# Patient Record
Sex: Female | Born: 1945
Health system: Southern US, Community
[De-identification: ages and names within clinical notes are randomized; demographics above are authoritative.]

## PROBLEM LIST (undated history)

## (undated) DIAGNOSIS — E119 Type 2 diabetes mellitus without complications: Secondary | ICD-10-CM

## (undated) DIAGNOSIS — T4145XA Adverse effect of unspecified anesthetic, initial encounter: Secondary | ICD-10-CM

## (undated) DIAGNOSIS — T8859XA Other complications of anesthesia, initial encounter: Secondary | ICD-10-CM

## (undated) DIAGNOSIS — I10 Essential (primary) hypertension: Secondary | ICD-10-CM

## (undated) DIAGNOSIS — E039 Hypothyroidism, unspecified: Secondary | ICD-10-CM

## (undated) DIAGNOSIS — Z9221 Personal history of antineoplastic chemotherapy: Secondary | ICD-10-CM

## (undated) DIAGNOSIS — Z9889 Other specified postprocedural states: Secondary | ICD-10-CM

## (undated) DIAGNOSIS — C801 Malignant (primary) neoplasm, unspecified: Secondary | ICD-10-CM

## (undated) DIAGNOSIS — R112 Nausea with vomiting, unspecified: Secondary | ICD-10-CM

## (undated) DIAGNOSIS — Z923 Personal history of irradiation: Secondary | ICD-10-CM

## (undated) DIAGNOSIS — R531 Weakness: Secondary | ICD-10-CM

## (undated) DIAGNOSIS — N189 Chronic kidney disease, unspecified: Secondary | ICD-10-CM

## (undated) DIAGNOSIS — D179 Benign lipomatous neoplasm, unspecified: Secondary | ICD-10-CM

## (undated) HISTORY — PX: OTHER SURGICAL HISTORY: SHX169

## (undated) HISTORY — DX: Type 2 diabetes mellitus without complications: E11.9

## (undated) HISTORY — PX: EYE SURGERY: SHX253

## (undated) HISTORY — DX: Essential (primary) hypertension: I10

## (undated) HISTORY — PX: KNEE ARTHROSCOPY: SUR90

---

## 1997-06-10 DIAGNOSIS — C801 Malignant (primary) neoplasm, unspecified: Secondary | ICD-10-CM

## 1997-06-10 DIAGNOSIS — Z9221 Personal history of antineoplastic chemotherapy: Secondary | ICD-10-CM

## 1997-06-10 HISTORY — DX: Malignant (primary) neoplasm, unspecified: C80.1

## 1997-06-10 HISTORY — DX: Personal history of antineoplastic chemotherapy: Z92.21

## 1997-06-10 HISTORY — PX: BREAST LUMPECTOMY: SHX2

## 1997-11-10 ENCOUNTER — Other Ambulatory Visit: Admission: RE | Admit: 1997-11-10 | Discharge: 1997-11-10 | Payer: Self-pay | Admitting: *Deleted

## 1997-11-21 ENCOUNTER — Encounter: Admission: RE | Admit: 1997-11-21 | Discharge: 1998-02-19 | Payer: Self-pay | Admitting: Radiation Oncology

## 1997-11-23 ENCOUNTER — Ambulatory Visit (HOSPITAL_COMMUNITY): Admission: RE | Admit: 1997-11-23 | Discharge: 1997-11-23 | Payer: Self-pay | Admitting: *Deleted

## 1997-12-27 ENCOUNTER — Ambulatory Visit (HOSPITAL_COMMUNITY): Admission: RE | Admit: 1997-12-27 | Discharge: 1997-12-27 | Payer: Self-pay | Admitting: *Deleted

## 1998-06-10 DIAGNOSIS — Z923 Personal history of irradiation: Secondary | ICD-10-CM

## 1998-06-10 HISTORY — DX: Personal history of irradiation: Z92.3

## 1998-06-28 ENCOUNTER — Encounter: Admission: RE | Admit: 1998-06-28 | Discharge: 1998-09-26 | Payer: Self-pay | Admitting: Radiation Oncology

## 1999-07-11 ENCOUNTER — Encounter: Payer: Self-pay | Admitting: Hematology and Oncology

## 1999-07-11 ENCOUNTER — Encounter: Admission: RE | Admit: 1999-07-11 | Discharge: 1999-07-11 | Payer: Self-pay | Admitting: Hematology and Oncology

## 1999-12-11 ENCOUNTER — Encounter: Payer: Self-pay | Admitting: Hematology and Oncology

## 1999-12-11 ENCOUNTER — Encounter: Admission: RE | Admit: 1999-12-11 | Discharge: 1999-12-11 | Payer: Self-pay | Admitting: Hematology and Oncology

## 2000-01-04 ENCOUNTER — Encounter: Admission: RE | Admit: 2000-01-04 | Discharge: 2000-01-04 | Payer: Self-pay | Admitting: Hematology and Oncology

## 2000-01-04 ENCOUNTER — Encounter: Payer: Self-pay | Admitting: Hematology and Oncology

## 2000-06-18 ENCOUNTER — Encounter: Payer: Self-pay | Admitting: Hematology and Oncology

## 2000-06-18 ENCOUNTER — Encounter: Admission: RE | Admit: 2000-06-18 | Discharge: 2000-06-18 | Payer: Self-pay | Admitting: Hematology and Oncology

## 2000-12-09 ENCOUNTER — Encounter: Payer: Self-pay | Admitting: Hematology and Oncology

## 2000-12-09 ENCOUNTER — Ambulatory Visit (HOSPITAL_COMMUNITY): Admission: RE | Admit: 2000-12-09 | Discharge: 2000-12-09 | Payer: Self-pay | Admitting: Hematology and Oncology

## 2001-06-22 ENCOUNTER — Encounter: Payer: Self-pay | Admitting: Hematology and Oncology

## 2001-06-22 ENCOUNTER — Encounter: Admission: RE | Admit: 2001-06-22 | Discharge: 2001-06-22 | Payer: Self-pay | Admitting: Hematology and Oncology

## 2001-10-12 ENCOUNTER — Encounter: Payer: Self-pay | Admitting: Family Medicine

## 2001-10-12 ENCOUNTER — Encounter: Admission: RE | Admit: 2001-10-12 | Discharge: 2001-10-12 | Payer: Self-pay | Admitting: Family Medicine

## 2001-12-08 ENCOUNTER — Encounter: Payer: Self-pay | Admitting: Family Medicine

## 2001-12-08 ENCOUNTER — Encounter: Admission: RE | Admit: 2001-12-08 | Discharge: 2001-12-08 | Payer: Self-pay | Admitting: Family Medicine

## 2002-06-23 ENCOUNTER — Encounter: Admission: RE | Admit: 2002-06-23 | Discharge: 2002-06-23 | Payer: Self-pay | Admitting: Hematology and Oncology

## 2002-06-23 ENCOUNTER — Encounter: Payer: Self-pay | Admitting: Hematology and Oncology

## 2003-01-18 ENCOUNTER — Encounter: Payer: Self-pay | Admitting: *Deleted

## 2003-01-18 ENCOUNTER — Emergency Department (HOSPITAL_COMMUNITY): Admission: EM | Admit: 2003-01-18 | Discharge: 2003-01-19 | Payer: Self-pay | Admitting: Emergency Medicine

## 2003-03-16 ENCOUNTER — Encounter: Admission: RE | Admit: 2003-03-16 | Discharge: 2003-04-06 | Payer: Self-pay | Admitting: Oncology

## 2003-03-24 ENCOUNTER — Encounter: Payer: Self-pay | Admitting: Family Medicine

## 2003-03-24 ENCOUNTER — Encounter: Admission: RE | Admit: 2003-03-24 | Discharge: 2003-03-24 | Payer: Self-pay | Admitting: Family Medicine

## 2003-06-28 ENCOUNTER — Encounter: Admission: RE | Admit: 2003-06-28 | Discharge: 2003-06-28 | Payer: Self-pay | Admitting: Family Medicine

## 2003-12-15 ENCOUNTER — Other Ambulatory Visit: Admission: RE | Admit: 2003-12-15 | Discharge: 2003-12-15 | Payer: Self-pay | Admitting: Family Medicine

## 2004-06-28 ENCOUNTER — Encounter: Admission: RE | Admit: 2004-06-28 | Discharge: 2004-06-28 | Payer: Self-pay | Admitting: Oncology

## 2005-01-25 ENCOUNTER — Ambulatory Visit: Payer: Self-pay | Admitting: Oncology

## 2005-01-31 ENCOUNTER — Ambulatory Visit (HOSPITAL_COMMUNITY): Admission: RE | Admit: 2005-01-31 | Discharge: 2005-01-31 | Payer: Self-pay | Admitting: Oncology

## 2005-03-04 ENCOUNTER — Inpatient Hospital Stay (HOSPITAL_COMMUNITY): Admission: RE | Admit: 2005-03-04 | Discharge: 2005-03-05 | Payer: Self-pay | Admitting: Obstetrics and Gynecology

## 2005-03-04 ENCOUNTER — Encounter (INDEPENDENT_AMBULATORY_CARE_PROVIDER_SITE_OTHER): Payer: Self-pay | Admitting: *Deleted

## 2005-03-08 ENCOUNTER — Ambulatory Visit (HOSPITAL_COMMUNITY): Admission: RE | Admit: 2005-03-08 | Discharge: 2005-03-08 | Payer: Self-pay | Admitting: Obstetrics and Gynecology

## 2005-03-13 ENCOUNTER — Ambulatory Visit: Payer: Self-pay | Admitting: Oncology

## 2005-06-19 ENCOUNTER — Emergency Department (HOSPITAL_COMMUNITY): Admission: EM | Admit: 2005-06-19 | Discharge: 2005-06-19 | Payer: Self-pay | Admitting: Emergency Medicine

## 2005-07-17 ENCOUNTER — Encounter: Admission: RE | Admit: 2005-07-17 | Discharge: 2005-07-17 | Payer: Self-pay | Admitting: Oncology

## 2005-09-11 ENCOUNTER — Ambulatory Visit: Payer: Self-pay | Admitting: Oncology

## 2005-09-12 LAB — COMPREHENSIVE METABOLIC PANEL
AST: 17 U/L (ref 0–37)
Alkaline Phosphatase: 57 U/L (ref 39–117)
BUN: 26 mg/dL — ABNORMAL HIGH (ref 6–23)
CO2: 27 mEq/L (ref 19–32)
Calcium: 9.4 mg/dL (ref 8.4–10.5)
Creatinine, Ser: 1.3 mg/dL — ABNORMAL HIGH (ref 0.4–1.2)

## 2005-09-12 LAB — CBC WITH DIFFERENTIAL/PLATELET
Basophils Absolute: 0 10*3/uL (ref 0.0–0.1)
Eosinophils Absolute: 0.2 10*3/uL (ref 0.0–0.5)
MCV: 81.3 fL (ref 81.0–101.0)
MONO#: 0.5 10*3/uL (ref 0.1–0.9)
NEUT#: 3.8 10*3/uL (ref 1.5–6.5)
NEUT%: 59.6 % (ref 39.6–76.8)
RBC: 4.42 10*6/uL (ref 3.70–5.32)
WBC: 6.4 10*3/uL (ref 3.9–10.0)
lymph#: 1.9 10*3/uL (ref 0.9–3.3)

## 2005-09-12 LAB — LACTATE DEHYDROGENASE: LDH: 140 U/L (ref 94–250)

## 2005-09-12 LAB — CANCER ANTIGEN 27.29: CA 27.29: 9 U/mL (ref 0–39)

## 2006-06-09 ENCOUNTER — Encounter: Admission: RE | Admit: 2006-06-09 | Discharge: 2006-06-09 | Payer: Self-pay | Admitting: Family Medicine

## 2006-07-31 ENCOUNTER — Encounter: Admission: RE | Admit: 2006-07-31 | Discharge: 2006-07-31 | Payer: Self-pay | Admitting: Oncology

## 2006-09-23 ENCOUNTER — Ambulatory Visit: Payer: Self-pay | Admitting: Oncology

## 2006-09-26 LAB — CBC WITH DIFFERENTIAL/PLATELET
BASO%: 0.5 % (ref 0.0–2.0)
EOS%: 3.4 % (ref 0.0–7.0)
HCT: 33.9 % — ABNORMAL LOW (ref 34.8–46.6)
LYMPH%: 31.8 % (ref 14.0–48.0)
MCH: 27.5 pg (ref 26.0–34.0)
MCHC: 34.5 g/dL (ref 32.0–36.0)
NEUT%: 57.9 % (ref 39.6–76.8)
Platelets: 230 10*3/uL (ref 145–400)

## 2006-09-26 LAB — COMPREHENSIVE METABOLIC PANEL
ALT: 11 U/L (ref 0–35)
AST: 13 U/L (ref 0–37)
Creatinine, Ser: 0.97 mg/dL (ref 0.40–1.20)
Total Bilirubin: 0.4 mg/dL (ref 0.3–1.2)

## 2006-09-26 LAB — LACTATE DEHYDROGENASE: LDH: 113 U/L (ref 94–250)

## 2006-10-03 LAB — IRON AND TIBC
%SAT: 13 % — ABNORMAL LOW (ref 20–55)
Iron: 50 ug/dL (ref 42–145)
TIBC: 387 ug/dL (ref 250–470)

## 2006-10-03 LAB — FERRITIN: Ferritin: 81 ng/mL (ref 10–291)

## 2007-06-11 HISTORY — PX: BREAST BIOPSY: SHX20

## 2007-08-03 ENCOUNTER — Encounter: Admission: RE | Admit: 2007-08-03 | Discharge: 2007-08-03 | Payer: Self-pay | Admitting: Family Medicine

## 2007-08-12 ENCOUNTER — Encounter (INDEPENDENT_AMBULATORY_CARE_PROVIDER_SITE_OTHER): Payer: Self-pay | Admitting: Diagnostic Radiology

## 2007-08-12 ENCOUNTER — Encounter: Admission: RE | Admit: 2007-08-12 | Discharge: 2007-08-12 | Payer: Self-pay | Admitting: Family Medicine

## 2007-08-29 ENCOUNTER — Emergency Department (HOSPITAL_COMMUNITY): Admission: EM | Admit: 2007-08-29 | Discharge: 2007-08-29 | Payer: Self-pay | Admitting: Emergency Medicine

## 2007-09-23 ENCOUNTER — Ambulatory Visit: Payer: Self-pay | Admitting: Oncology

## 2007-09-25 LAB — CBC & DIFF AND RETIC
BASO%: 1.2 % (ref 0.0–2.0)
Basophils Absolute: 0.1 10*3/uL (ref 0.0–0.1)
IRF: 0.25 (ref 0.130–0.330)
LYMPH%: 33.6 % (ref 14.0–48.0)
MCHC: 35.3 g/dL (ref 32.0–36.0)
MCV: 79.7 fL — ABNORMAL LOW (ref 81.0–101.0)
RBC: 4.21 10*6/uL (ref 3.70–5.32)
RDW: 13.4 % (ref 11.3–14.5)
RETIC #: 47.6 10*3/uL (ref 19.7–115.1)
Retic %: 1.1 % (ref 0.4–2.3)
WBC: 6 10*3/uL (ref 3.9–10.0)
lymph#: 2 10*3/uL (ref 0.9–3.3)

## 2007-09-25 LAB — COMPREHENSIVE METABOLIC PANEL
ALT: 15 U/L (ref 0–35)
Albumin: 4.1 g/dL (ref 3.5–5.2)
Chloride: 103 mEq/L (ref 96–112)
Potassium: 4.4 mEq/L (ref 3.5–5.3)
Total Bilirubin: 0.4 mg/dL (ref 0.3–1.2)

## 2007-09-25 LAB — LACTATE DEHYDROGENASE: LDH: 136 U/L (ref 94–250)

## 2007-09-25 LAB — FERRITIN: Ferritin: 66 ng/mL (ref 10–291)

## 2007-09-25 LAB — IRON AND TIBC
%SAT: 20 % (ref 20–55)
Iron: 67 ug/dL (ref 42–145)
TIBC: 332 ug/dL (ref 250–470)
UIBC: 265 ug/dL

## 2007-09-25 LAB — CANCER ANTIGEN 27.29: CA 27.29: 9 U/mL (ref 0–39)

## 2007-09-25 LAB — MORPHOLOGY: PLT EST: ADEQUATE

## 2007-09-25 LAB — CHCC SMEAR

## 2008-08-03 ENCOUNTER — Encounter: Admission: RE | Admit: 2008-08-03 | Discharge: 2008-08-03 | Payer: Self-pay | Admitting: Family Medicine

## 2009-08-04 ENCOUNTER — Encounter: Admission: RE | Admit: 2009-08-04 | Discharge: 2009-08-04 | Payer: Self-pay | Admitting: Family Medicine

## 2010-06-30 ENCOUNTER — Other Ambulatory Visit: Payer: Self-pay | Admitting: Family Medicine

## 2010-06-30 DIAGNOSIS — Z9889 Other specified postprocedural states: Secondary | ICD-10-CM

## 2010-07-01 ENCOUNTER — Encounter: Payer: Self-pay | Admitting: Internal Medicine

## 2010-08-06 ENCOUNTER — Ambulatory Visit
Admission: RE | Admit: 2010-08-06 | Discharge: 2010-08-06 | Disposition: A | Discharge status: Home / Self Care | Payer: BC Managed Care – PPO | Source: Ambulatory Visit | Attending: Family Medicine | Admitting: Family Medicine

## 2010-08-06 DIAGNOSIS — Z9889 Other specified postprocedural states: Secondary | ICD-10-CM

## 2010-10-26 NOTE — H&P (Signed)
NAMERAIANA, PHARRIS               ACCOUNT NO.:  [id number]   MEDICAL RECORD NO.:  [id number]           PATIENT TYPE:   LOCATION:                                 FACILITY:   PHYSICIAN:  Charles A. Delcambre, MD    DATE OF BIRTH:   DATE OF ADMISSION:  03/04/2005  DATE OF DISCHARGE:                                HISTORY & PHYSICAL   ADDENDUM:  H&P has been reviewed, dictated originally on January 21, 2005.  There are no changes.  No changes in the history and physical are noted.  Physical examination is unchanged.  Heart and lung examination is updated  today and status is unchanged.  Will proceed on as outlined in the history  and physical as dictated previously for TVH/BSO, A&P repair as technically  feasible at the time of procedure.      Charles A. Sydnee Cabal, MD  Electronically Signed     CAD/MEDQ  D:  02/25/2005  T:  02/25/2005  Job:  [id number]

## 2010-10-26 NOTE — Op Note (Signed)
NAMEKRISTIEN, [name]             ACCOUNT NO.:  [id number]   MEDICAL RECORD NO.:  [id number]          PATIENT TYPE:  INP   LOCATION:  9302                          [facility]ACILITY:  [facility]   PHYSICIAN:  Charles A. Delcambre, MDDATE OF BIRTH:  [birth year]   DATE OF PROCEDURE:  03/04/2005  DATE OF DISCHARGE:                                 OPERATIVE REPORT   PREOPERATIVE DIAGNOSES:  1.  Cystocele.  2.  Rectocele.  3.  Uterine prolapse.   POSTOPERATIVE DIAGNOSES:  1.  Cystocele.  2.  Minor Rectocele.  3.  Uterine prolapse.   PROCEDURE:  1.  Transvaginal hysterectomy.  2.  Right salpingo-oophorectomy.  3.  Laparoscopic left salpingo-oophorectomy and lap retrieval   ASSISTANT:  Ginger Carne, M.D.   COMPLICATIONS:  At the time of vaginal hysterectomy and right salpingo-  oophorectomy, the left ovary was noted to be high in the pelvis.  In an  attempt to get the left ovary out, oozing was encountered on the utero-  ovarian pedicle.  On further attempts to isolate the pedicle, the lap was  pushed high and was unable to be retrieved vaginally.  There was abandonment  of removal of the ovary vaginally.  For this reason, the decision was  obviously made to proceed with laparoscopy to retrieve the lap and at the  same time to go ahead and remove the ovary laparoscopically.  See dictation  below.   ANESTHESIA:  General by the endotracheal route.   SPECIMENS:  Uterus, tubes, and ovaries to pathology.   ESTIMATED BLOOD LOSS:  Less than or equal to 200 mL.   COUNTS:  Instrument, sponge, and needle count correct x2.   DESCRIPTION OF PROCEDURE:  The patient was taken to the operating room and  placed in supine position.  General anesthetic was induced without  difficulty.  She was then placed in dorsal lithotomy position in universal  stirrups and sterile prep and drape was undertaken.  She was drained with a  straight catheter in the bladder and the cervix was noted to be flush with  the  vaginal cuff with history of some type of cervical pathology cells  treated.  Pathology of recent date had been negative through my office.  The cervical os could be seen but could not be penetrated.  The cervical  area was grasped with Allis clamps and a Bovie was used to open the vaginal  mucosa over the os area of the cervix.  Mayo scissors were used to undermine  the vaginal mucosa and peel this back over the cervix.  The cervix was then  isolated and the vaginal mucosa was mobilized over the cervix anteriorly and  posteriorly with sharp dissection.  This was taken laterally to expose the  uterosacral ligaments.  Anteriorly, the bladder was taken off of the cervix  with some sharp dissection and blunt dissection with the finger and then  moistened RayTec lap was packed into this area, further dissecting the  bladder off of the anterior uterus.  The peritoneum was entered with blunt  dissection and the bowel was clearly seen.  There  was no evidence of damage  to the bladder.  Posterior colpotomy was undertaken.  No damage to bowel was  noted.  The uterosacral ligaments were then transected and transfixion  stitch with 2-0 Vicryl held this.  The uterine vessels were isolated next  and simple stitched with 0 Vicryl with good hemostasis resulting on either  side.  The cardinal ligaments were taken down in successive pedicles and  transfixion stitched with 0 Vicryl, the final stitches of which cross  clamped the uterine cornua regions with free tie and then a transfixion  stitch of 0 Vicryl.  Hemostasis was excellent.  The right tube and ovary  with very minimal tube removal were isolated, free tied, transfixion  stitched, and removed.  Hemostasis was excellent.  The left ovary as noted  above could not be isolated and complications noted above.  The cuff was  closed with 0 Vicryl figure-of-8 sutures for laparoscopy to be undertaken.  Attention was then turned to laparoscopy.  The patient was  placed in lower  dorsal lithotomy position and reprepped and draped.  Umbilical and  suprapubic areas were injected with 0.25% plain Marcaine, a total of 7 mL  divided.  The umbilical port was placed, first with the Veress needle and  anterior traction.  Aspiration, injection, and reaspiration hanging drop  test showed insufflation pressure was under 8 mmHg at 2 L insufflation per  minute, all indicating intraperitoneal location.  Adequate pneumoperitoneum  at 3 L insufflation with CO2 was noted.  The Veress needle was removed and  10 mm trocar port was placed and verification of intraperitoneal location  was made and there was o damage to bowel, bladder, or vascular structures.  Under direct visualization, a 10 mm port was placed in the midline  suprapubically.  Pelvectomy was then undertaken.  The lap was clearly seen  underlying the omentum and lateral to the sigmoid colon on the left.  This  was grasped with a tooth grasper and removed through the 10 mm trocar site  suprapubically without difficulty.  Irrigation was carried out.  All areas  had good hemostasis.  The left ovary was grasped, lifted up.  Beneath the  ovary and under clear visualization was the left ureter seen peristalsing.  The infundibulopelvic pedicle was isolated well lateral and above the ureter  and tripolar cautery was then used to cauterize and cut the  infundibulopelvic pedicle without damage to the surrounding strictures,  including ureter, under direct visualization.  Hemostasis of the pedicle was  excellent.  Successive pedicles were taken and the ovary and tube were  removed without difficulty.  These were removed through the 10 mm port in  the midline.  Irrigation was carried out.  Hemostasis was again verified in  all areas.  Desufflation was allowed to occur.  Low-pressure hemostasis was  excellent.  Single stitches of 0 Vicryl were used to close the 10 mm fascial areas for the trocars.  Monocryl, 3-0, was  used to close the skin.  Vicryl,  3-0, was placed in the left lower quadrant skin area.  Attention was then  turned again to the vagina.  Cuff sutures that had been placed earlier with  interrupted single stitches were removed.  The cuff was reclosed with angle  sutures on either angle and then a running 0 Vicryl across the cuff.  Attention was turned to anterior repair.  Before the final closure of the  cuff, attention was given to the anterior repair.  Vaginal mucosa  was  undermined anteriorly and the bladder was dissected off sharply with the  Metzenbaum scissors and the knife to expose the endopelvic fascia.  This was  then closed across the cystocele, reducing the cystocele with interrupted 2-  0 Vicryl sutures.  These were tied, carefully reducing the endopelvic fascia  over the cystocele with good results.  Extra vaginal mucosa was removed and  then 2-0 Vicryl running locking suture was used to close the anterior  vaginal mucosa all the way back to the vaginal cuff area.  Hemostasis was  excellent.  The vagina was packed with 1 inch gauze moistened with Estrace  vaginal cream.  The patient tolerated the procedure well, was awakened and  taken to the recovery room with physician in attendance.      Charles A. Sydnee Cabal, MD  Electronically Signed     CAD/MEDQ  D:  03/04/2005  T:  03/04/2005  Job:  [id number]

## 2010-10-26 NOTE — H&P (Signed)
NAMEKAOIR, LOREE             ACCOUNT NO.:  [id number]   MEDICAL RECORD NO.:  [id number]          PATIENT TYPE:  INP   LOCATION:                                FACILITY:  [facility]   PHYSICIAN:  Charles A. Delcambre, MDDATE OF BIRTH:  November 26, 1945   DATE OF ADMISSION:  01/21/2005  DATE OF DISCHARGE:                                HISTORY & PHYSICAL   CHIEF COMPLAINT:  Bulge at the vagina, symptomatic.   HISTORY OF PRESENT ILLNESS:  A 65 year old gravida 4, para 2-0-0-2, with  bulging something out of her vagina and into her underwear with pressure and  some low back pain, rubbing onto her underwear.  No vaginal bleeding.  Some  urgency for urination at night and sometimes waking up wet and at other  times not being able to make it to the restroom.  Main problem is the  bulging out, having to push something back in to achieve comfort.  This has  been getting worse over the last several years, but even more so in the last  several months.  Normal Pap smear in July of 2005 with Elsworth Soho,  M.D.   PAST MEDICAL HISTORY:  Diabetes, hypertension, and thyroid disease.   PAST SURGICAL HISTORY:  Breast cancer, 6 years out now from therapy.  Hypothyroidism.   MEDICATIONS:  1.  Glipizide 10 mg two tablets once a day.  2.  Metformin 2 to 2-1/2 500 mg once a day.  3.  Levothyroxine 150 mcg once a day.  4.  Lisinopril 20 mg once a day.  5.  Hydrochlorothiazide 25 mg once a day.  6.  Vitamin E.  7.  Calcium 600 mg plus 400 units of vitamin D once a day.   ALLERGIES:  CODEINE, reaction not specified.   SOCIAL HISTORY:  No tobacco, ethanol, or drug use, or STD exposure in the  past.  The patient is married and in a monogamous relationship with her  husband.   FAMILY HISTORY:  Father is deceased, age not specified.  Mother is age 56  and deceased from lung cancer, hypertension, and heart disease.  Brother  deceased, age not specified from hypertension.  Sister age 53 with lung  cancer,  hypertension, and diabetes.  Sister deceased at age 109 with heart  disease.  Sister deceased at age 10 with heart disease.  Otherwise negative.   REVIEW OF SYSTEMS:  Symptomatic uterine prolapse.  Denies fever, chills,  rashes, lesions, headaches, or dizziness.  Some seasonal allergies.  No  chest pain, shortness of breath, wheezing.  No diarrhea, constipation,  bleeding, melena, or hematochezia.  Urgency as noted above, frequency,  dysuria denied.  Incontinence as noted above.  Hematuria not noted.  No  galactorrhea or emotional changes.   PHYSICAL EXAMINATION:  GENERAL:  Alert and oriented x3.  In no acute  distress.  VITAL SIGNS:  Pulse 72, respirations 20, blood pressure 138/82.  HEENT:  Grossly within normal limits.  NECK:  Supple without thyromegaly or adenopathy.  LUNGS:  Clear bilaterally.  BACK:  No CVAT.  Vertebral column nontender to palpation.  BREASTS:  Deferred to PCP examination within the last 6 months.  HEART:  Regular rate and rhythm without murmurs, rubs, or gallops.  ABDOMEN:  Soft, flat, and nontender.  No hepatosplenomegaly or other masses  noted.  PELVIC:  Normal external female genitalia.  Bartholin's, urethra, and  Skene's within normal limits.  Vulva; there is a fleshy-appearing  protuberance from the vulva at the vaginal introitus, soft, and easily  replaced in the vagina.  It appears to be a cystocele on first glance and  touch to replace.  A speculum was placed and a very large cystocele was  noted.  A small rectocele was noted.  Uterus appeared to be small and up  inside the vault.  With Valsalva, the large cystocele protrudes.  On  bimanual examination, the uterus palpates to be small and very high.  I had  difficulty seeing the cervix with moving the large cystocele anteriorly and  could not definitely tell that the uterus protruded down because of the  large cystocele.  RECTAL:  Rectovaginal examination confirmed the above noted examination.  Good  anal sphincter tone was present.  Brown stool was noted in the vault.  Examination was nontender.  EXTREMITIES:  Nontender with minimal edema, symmetrical bilaterally.   ASSESSMENT:  Large cystocele, small rectocele, and some uterine prolapse  behind the cystocele.   PLAN:  Transvaginal hysterectomy with uterus measured on ultrasound 6.2 x  3.9 cm and normal appearing ovaries.  Possibly would proceed on with BSO  bilaterally and would move on to mainly procedure to be cystocele repair,  possible rectocele repair.  The patient gives informed consent and accepts  the risks of infection, bleeding, bowel and bladder damage, and blood  products risks including HIV exposure, hepatitis exposure.  She understands  if the uterus is not descended down and just mainly under anesthesia, we  find mainly a very large cystocele, we may proceed on with cystocele only  repair and possible rectocele.  All questions were answered.  We will  proceed with surgery as outlined.  She go on and take her hypertension  medications with a sip of water the morning of admission.  Will hold her  diabetes medication that morning as she will be NPO.  SCD's will be used  perioperatively, Cefoxitin will be given 1 gram preoperatively and CBC and  CMP will be drawn perioperatively.  All questions were answered and we will  proceed as outlined.  Will likely get [facility] to manage while she  is in the hospital for multiple medical problems.      Charles A. Sydnee Cabal, MD  Electronically Signed     CAD/MEDQ  D:  01/21/2005  T:  01/21/2005  Job:  [id number]

## 2011-07-08 ENCOUNTER — Other Ambulatory Visit: Payer: Self-pay | Admitting: Family Medicine

## 2011-07-08 DIAGNOSIS — Z1231 Encounter for screening mammogram for malignant neoplasm of breast: Secondary | ICD-10-CM

## 2011-08-08 ENCOUNTER — Ambulatory Visit
Admission: RE | Admit: 2011-08-08 | Discharge: 2011-08-08 | Disposition: A | Discharge status: Home / Self Care | Payer: Medicare Other | Source: Ambulatory Visit | Attending: Family Medicine | Admitting: Family Medicine

## 2011-08-08 DIAGNOSIS — Z1231 Encounter for screening mammogram for malignant neoplasm of breast: Secondary | ICD-10-CM

## 2011-12-05 ENCOUNTER — Other Ambulatory Visit: Payer: Self-pay | Admitting: Family Medicine

## 2011-12-05 DIAGNOSIS — M79606 Pain in leg, unspecified: Secondary | ICD-10-CM

## 2011-12-06 ENCOUNTER — Ambulatory Visit
Admission: RE | Admit: 2011-12-06 | Discharge: 2011-12-06 | Disposition: A | Discharge status: Home / Self Care | Payer: Medicare Other | Source: Ambulatory Visit | Attending: Family Medicine | Admitting: Family Medicine

## 2011-12-06 DIAGNOSIS — M79606 Pain in leg, unspecified: Secondary | ICD-10-CM

## 2012-03-12 ENCOUNTER — Other Ambulatory Visit: Payer: Self-pay | Admitting: Family Medicine

## 2012-03-12 ENCOUNTER — Ambulatory Visit
Admission: RE | Admit: 2012-03-12 | Discharge: 2012-03-12 | Disposition: A | Discharge status: Home / Self Care | Payer: Medicare Other | Source: Ambulatory Visit | Attending: Family Medicine | Admitting: Family Medicine

## 2012-03-12 DIAGNOSIS — E871 Hypo-osmolality and hyponatremia: Secondary | ICD-10-CM

## 2012-03-13 ENCOUNTER — Other Ambulatory Visit: Payer: Self-pay | Admitting: Family Medicine

## 2012-03-13 DIAGNOSIS — R9389 Abnormal findings on diagnostic imaging of other specified body structures: Secondary | ICD-10-CM

## 2012-03-17 ENCOUNTER — Ambulatory Visit
Admission: RE | Admit: 2012-03-17 | Discharge: 2012-03-17 | Disposition: A | Discharge status: Home / Self Care | Payer: Medicare Other | Source: Ambulatory Visit | Attending: Family Medicine | Admitting: Family Medicine

## 2012-03-17 DIAGNOSIS — R9389 Abnormal findings on diagnostic imaging of other specified body structures: Secondary | ICD-10-CM

## 2012-04-02 ENCOUNTER — Other Ambulatory Visit: Payer: Self-pay | Admitting: Family Medicine

## 2012-04-02 DIAGNOSIS — R9389 Abnormal findings on diagnostic imaging of other specified body structures: Secondary | ICD-10-CM

## 2012-04-03 ENCOUNTER — Other Ambulatory Visit: Payer: Medicare Other

## 2012-04-10 ENCOUNTER — Ambulatory Visit
Admission: RE | Admit: 2012-04-10 | Discharge: 2012-04-10 | Disposition: A | Discharge status: Home / Self Care | Payer: Medicare Other | Source: Ambulatory Visit | Attending: Family Medicine | Admitting: Family Medicine

## 2012-04-10 DIAGNOSIS — R9389 Abnormal findings on diagnostic imaging of other specified body structures: Secondary | ICD-10-CM

## 2012-04-10 MED ORDER — IOHEXOL 300 MG/ML  SOLN
100.0000 mL | Freq: Once | INTRAMUSCULAR | Status: AC | PRN
  Administered 2012-04-10: 100 mL via INTRAVENOUS

## 2012-07-06 ENCOUNTER — Other Ambulatory Visit: Payer: Self-pay | Admitting: Family Medicine

## 2012-07-06 DIAGNOSIS — Z1231 Encounter for screening mammogram for malignant neoplasm of breast: Secondary | ICD-10-CM

## 2012-08-10 ENCOUNTER — Ambulatory Visit
Admission: RE | Admit: 2012-08-10 | Discharge: 2012-08-10 | Disposition: A | Discharge status: Home / Self Care | Payer: Medicare Other | Source: Ambulatory Visit | Attending: Family Medicine | Admitting: Family Medicine

## 2013-06-10 HISTORY — PX: BREAST BIOPSY: SHX20

## 2013-07-07 ENCOUNTER — Other Ambulatory Visit: Payer: Self-pay

## 2013-07-07 DIAGNOSIS — Z1231 Encounter for screening mammogram for malignant neoplasm of breast: Secondary | ICD-10-CM

## 2013-07-07 DIAGNOSIS — Z9889 Other specified postprocedural states: Secondary | ICD-10-CM

## 2013-07-07 DIAGNOSIS — Z853 Personal history of malignant neoplasm of breast: Secondary | ICD-10-CM

## 2013-08-11 ENCOUNTER — Ambulatory Visit
Admission: RE | Admit: 2013-08-11 | Discharge: 2013-08-11 | Disposition: A | Discharge status: Home / Self Care | Payer: Commercial Managed Care - HMO | Source: Ambulatory Visit

## 2013-08-11 DIAGNOSIS — Z1231 Encounter for screening mammogram for malignant neoplasm of breast: Secondary | ICD-10-CM

## 2013-08-11 DIAGNOSIS — Z9889 Other specified postprocedural states: Secondary | ICD-10-CM

## 2013-08-11 DIAGNOSIS — Z853 Personal history of malignant neoplasm of breast: Secondary | ICD-10-CM

## 2013-08-12 ENCOUNTER — Other Ambulatory Visit: Payer: Self-pay | Admitting: Family Medicine

## 2013-08-12 DIAGNOSIS — R928 Other abnormal and inconclusive findings on diagnostic imaging of breast: Secondary | ICD-10-CM

## 2013-08-25 ENCOUNTER — Other Ambulatory Visit: Payer: Self-pay | Admitting: Family Medicine

## 2013-08-25 ENCOUNTER — Ambulatory Visit
Admission: RE | Admit: 2013-08-25 | Discharge: 2013-08-25 | Disposition: A | Discharge status: Home / Self Care | Payer: Commercial Managed Care - HMO | Source: Ambulatory Visit | Attending: Family Medicine | Admitting: Family Medicine

## 2013-08-25 DIAGNOSIS — R928 Other abnormal and inconclusive findings on diagnostic imaging of breast: Secondary | ICD-10-CM

## 2013-08-30 ENCOUNTER — Ambulatory Visit
Admission: RE | Admit: 2013-08-30 | Discharge: 2013-08-30 | Disposition: A | Discharge status: Home / Self Care | Payer: Commercial Managed Care - HMO | Source: Ambulatory Visit | Attending: Family Medicine | Admitting: Family Medicine

## 2013-08-30 DIAGNOSIS — R928 Other abnormal and inconclusive findings on diagnostic imaging of breast: Secondary | ICD-10-CM

## 2014-07-22 ENCOUNTER — Other Ambulatory Visit: Payer: Self-pay

## 2014-07-22 DIAGNOSIS — M792 Neuralgia and neuritis, unspecified: Secondary | ICD-10-CM | POA: Diagnosis not present

## 2014-07-22 DIAGNOSIS — M545 Low back pain: Secondary | ICD-10-CM | POA: Diagnosis not present

## 2014-07-22 DIAGNOSIS — I10 Essential (primary) hypertension: Secondary | ICD-10-CM | POA: Diagnosis not present

## 2014-07-22 DIAGNOSIS — M25531 Pain in right wrist: Secondary | ICD-10-CM | POA: Diagnosis not present

## 2014-07-22 DIAGNOSIS — Z1211 Encounter for screening for malignant neoplasm of colon: Secondary | ICD-10-CM | POA: Diagnosis not present

## 2014-07-22 DIAGNOSIS — Z1231 Encounter for screening mammogram for malignant neoplasm of breast: Secondary | ICD-10-CM

## 2014-07-22 DIAGNOSIS — E119 Type 2 diabetes mellitus without complications: Secondary | ICD-10-CM | POA: Diagnosis not present

## 2014-07-22 DIAGNOSIS — E039 Hypothyroidism, unspecified: Secondary | ICD-10-CM | POA: Diagnosis not present

## 2014-07-22 DIAGNOSIS — J309 Allergic rhinitis, unspecified: Secondary | ICD-10-CM | POA: Diagnosis not present

## 2014-08-05 DIAGNOSIS — Z1211 Encounter for screening for malignant neoplasm of colon: Secondary | ICD-10-CM | POA: Diagnosis not present

## 2014-08-15 ENCOUNTER — Ambulatory Visit
Admission: RE | Admit: 2014-08-15 | Discharge: 2014-08-15 | Disposition: A | Discharge status: Home / Self Care | Payer: Commercial Managed Care - HMO | Source: Ambulatory Visit

## 2014-08-15 DIAGNOSIS — M5416 Radiculopathy, lumbar region: Secondary | ICD-10-CM | POA: Diagnosis not present

## 2014-08-15 DIAGNOSIS — Z1231 Encounter for screening mammogram for malignant neoplasm of breast: Secondary | ICD-10-CM

## 2014-08-17 DIAGNOSIS — H5210 Myopia, unspecified eye: Secondary | ICD-10-CM | POA: Diagnosis not present

## 2014-08-17 DIAGNOSIS — E119 Type 2 diabetes mellitus without complications: Secondary | ICD-10-CM | POA: Diagnosis not present

## 2014-08-17 DIAGNOSIS — H524 Presbyopia: Secondary | ICD-10-CM | POA: Diagnosis not present

## 2015-01-20 DIAGNOSIS — I10 Essential (primary) hypertension: Secondary | ICD-10-CM | POA: Diagnosis not present

## 2015-01-20 DIAGNOSIS — E1165 Type 2 diabetes mellitus with hyperglycemia: Secondary | ICD-10-CM | POA: Diagnosis not present

## 2015-01-20 DIAGNOSIS — M545 Low back pain: Secondary | ICD-10-CM | POA: Diagnosis not present

## 2015-01-20 DIAGNOSIS — M792 Neuralgia and neuritis, unspecified: Secondary | ICD-10-CM | POA: Diagnosis not present

## 2015-01-20 DIAGNOSIS — H43399 Other vitreous opacities, unspecified eye: Secondary | ICD-10-CM | POA: Diagnosis not present

## 2015-01-20 DIAGNOSIS — E039 Hypothyroidism, unspecified: Secondary | ICD-10-CM | POA: Diagnosis not present

## 2015-03-02 DIAGNOSIS — Z23 Encounter for immunization: Secondary | ICD-10-CM | POA: Diagnosis not present

## 2015-07-18 ENCOUNTER — Other Ambulatory Visit: Payer: Self-pay

## 2015-07-18 DIAGNOSIS — Z1231 Encounter for screening mammogram for malignant neoplasm of breast: Secondary | ICD-10-CM

## 2015-08-08 DIAGNOSIS — I10 Essential (primary) hypertension: Secondary | ICD-10-CM | POA: Diagnosis not present

## 2015-08-08 DIAGNOSIS — L659 Nonscarring hair loss, unspecified: Secondary | ICD-10-CM | POA: Diagnosis not present

## 2015-08-08 DIAGNOSIS — E039 Hypothyroidism, unspecified: Secondary | ICD-10-CM | POA: Diagnosis not present

## 2015-08-08 DIAGNOSIS — M792 Neuralgia and neuritis, unspecified: Secondary | ICD-10-CM | POA: Diagnosis not present

## 2015-08-08 DIAGNOSIS — E119 Type 2 diabetes mellitus without complications: Secondary | ICD-10-CM | POA: Diagnosis not present

## 2015-08-08 DIAGNOSIS — J309 Allergic rhinitis, unspecified: Secondary | ICD-10-CM | POA: Diagnosis not present

## 2015-08-08 DIAGNOSIS — Z7984 Long term (current) use of oral hypoglycemic drugs: Secondary | ICD-10-CM | POA: Diagnosis not present

## 2015-08-16 ENCOUNTER — Ambulatory Visit
Admission: RE | Admit: 2015-08-16 | Discharge: 2015-08-16 | Disposition: A | Discharge status: Home / Self Care | Payer: Commercial Managed Care - HMO | Source: Ambulatory Visit

## 2015-08-16 DIAGNOSIS — Z1231 Encounter for screening mammogram for malignant neoplasm of breast: Secondary | ICD-10-CM | POA: Diagnosis not present

## 2015-08-18 DIAGNOSIS — H5210 Myopia, unspecified eye: Secondary | ICD-10-CM | POA: Diagnosis not present

## 2015-08-18 DIAGNOSIS — H524 Presbyopia: Secondary | ICD-10-CM | POA: Diagnosis not present

## 2015-09-06 DIAGNOSIS — L659 Nonscarring hair loss, unspecified: Secondary | ICD-10-CM | POA: Diagnosis not present

## 2015-09-08 DIAGNOSIS — L659 Nonscarring hair loss, unspecified: Secondary | ICD-10-CM | POA: Diagnosis not present

## 2015-09-08 DIAGNOSIS — Z79899 Other long term (current) drug therapy: Secondary | ICD-10-CM | POA: Diagnosis not present

## 2016-02-07 DIAGNOSIS — M545 Low back pain: Secondary | ICD-10-CM | POA: Diagnosis not present

## 2016-02-07 DIAGNOSIS — Z23 Encounter for immunization: Secondary | ICD-10-CM | POA: Diagnosis not present

## 2016-02-07 DIAGNOSIS — I10 Essential (primary) hypertension: Secondary | ICD-10-CM | POA: Diagnosis not present

## 2016-02-07 DIAGNOSIS — M792 Neuralgia and neuritis, unspecified: Secondary | ICD-10-CM | POA: Diagnosis not present

## 2016-02-07 DIAGNOSIS — Z7984 Long term (current) use of oral hypoglycemic drugs: Secondary | ICD-10-CM | POA: Diagnosis not present

## 2016-02-07 DIAGNOSIS — J309 Allergic rhinitis, unspecified: Secondary | ICD-10-CM | POA: Diagnosis not present

## 2016-02-07 DIAGNOSIS — E119 Type 2 diabetes mellitus without complications: Secondary | ICD-10-CM | POA: Diagnosis not present

## 2016-02-07 DIAGNOSIS — E039 Hypothyroidism, unspecified: Secondary | ICD-10-CM | POA: Diagnosis not present

## 2016-02-08 DIAGNOSIS — M545 Low back pain: Secondary | ICD-10-CM | POA: Diagnosis not present

## 2016-02-16 DIAGNOSIS — M545 Low back pain: Secondary | ICD-10-CM | POA: Diagnosis not present

## 2016-02-21 DIAGNOSIS — M545 Low back pain: Secondary | ICD-10-CM | POA: Diagnosis not present

## 2016-02-23 DIAGNOSIS — M545 Low back pain: Secondary | ICD-10-CM | POA: Diagnosis not present

## 2016-02-26 DIAGNOSIS — M545 Low back pain: Secondary | ICD-10-CM | POA: Diagnosis not present

## 2016-02-29 DIAGNOSIS — M545 Low back pain: Secondary | ICD-10-CM | POA: Diagnosis not present

## 2016-03-05 DIAGNOSIS — M545 Low back pain: Secondary | ICD-10-CM | POA: Diagnosis not present

## 2016-03-07 DIAGNOSIS — M545 Low back pain: Secondary | ICD-10-CM | POA: Diagnosis not present

## 2016-03-11 DIAGNOSIS — M545 Low back pain: Secondary | ICD-10-CM | POA: Diagnosis not present

## 2016-03-13 DIAGNOSIS — M545 Low back pain: Secondary | ICD-10-CM | POA: Diagnosis not present

## 2016-03-19 DIAGNOSIS — M545 Low back pain: Secondary | ICD-10-CM | POA: Diagnosis not present

## 2016-03-21 DIAGNOSIS — M545 Low back pain: Secondary | ICD-10-CM | POA: Diagnosis not present

## 2016-03-25 DIAGNOSIS — M545 Low back pain: Secondary | ICD-10-CM | POA: Diagnosis not present

## 2016-03-28 DIAGNOSIS — M545 Low back pain: Secondary | ICD-10-CM | POA: Diagnosis not present

## 2016-04-13 DIAGNOSIS — I10 Essential (primary) hypertension: Secondary | ICD-10-CM | POA: Diagnosis not present

## 2016-04-13 DIAGNOSIS — R51 Headache: Secondary | ICD-10-CM | POA: Diagnosis not present

## 2016-05-27 DIAGNOSIS — L309 Dermatitis, unspecified: Secondary | ICD-10-CM | POA: Diagnosis not present

## 2016-05-27 DIAGNOSIS — R51 Headache: Secondary | ICD-10-CM | POA: Diagnosis not present

## 2016-05-27 DIAGNOSIS — I10 Essential (primary) hypertension: Secondary | ICD-10-CM | POA: Diagnosis not present

## 2016-07-11 ENCOUNTER — Other Ambulatory Visit: Payer: Self-pay | Admitting: Family Medicine

## 2016-07-11 DIAGNOSIS — Z1231 Encounter for screening mammogram for malignant neoplasm of breast: Secondary | ICD-10-CM

## 2016-07-30 DIAGNOSIS — H34832 Tributary (branch) retinal vein occlusion, left eye, with macular edema: Secondary | ICD-10-CM | POA: Diagnosis not present

## 2016-07-30 DIAGNOSIS — H35361 Drusen (degenerative) of macula, right eye: Secondary | ICD-10-CM | POA: Diagnosis not present

## 2016-07-30 DIAGNOSIS — E113293 Type 2 diabetes mellitus with mild nonproliferative diabetic retinopathy without macular edema, bilateral: Secondary | ICD-10-CM | POA: Diagnosis not present

## 2016-08-06 DIAGNOSIS — H34832 Tributary (branch) retinal vein occlusion, left eye, with macular edema: Secondary | ICD-10-CM | POA: Diagnosis not present

## 2016-08-08 DIAGNOSIS — J309 Allergic rhinitis, unspecified: Secondary | ICD-10-CM | POA: Diagnosis not present

## 2016-08-08 DIAGNOSIS — E119 Type 2 diabetes mellitus without complications: Secondary | ICD-10-CM | POA: Diagnosis not present

## 2016-08-08 DIAGNOSIS — M792 Neuralgia and neuritis, unspecified: Secondary | ICD-10-CM | POA: Diagnosis not present

## 2016-08-08 DIAGNOSIS — I10 Essential (primary) hypertension: Secondary | ICD-10-CM | POA: Diagnosis not present

## 2016-08-08 DIAGNOSIS — E039 Hypothyroidism, unspecified: Secondary | ICD-10-CM | POA: Diagnosis not present

## 2016-08-16 ENCOUNTER — Ambulatory Visit
Admission: RE | Admit: 2016-08-16 | Discharge: 2016-08-16 | Disposition: A | Discharge status: Home / Self Care | Payer: Commercial Managed Care - HMO | Source: Ambulatory Visit | Attending: Family Medicine | Admitting: Family Medicine

## 2016-08-16 DIAGNOSIS — Z1231 Encounter for screening mammogram for malignant neoplasm of breast: Secondary | ICD-10-CM | POA: Diagnosis not present

## 2016-08-16 HISTORY — DX: Personal history of irradiation: Z92.3

## 2016-08-16 HISTORY — DX: Personal history of antineoplastic chemotherapy: Z92.21

## 2016-09-25 ENCOUNTER — Ambulatory Visit: Payer: Self-pay | Admitting: Podiatry

## 2016-10-02 ENCOUNTER — Encounter: Payer: Self-pay | Admitting: Podiatry

## 2016-10-02 ENCOUNTER — Ambulatory Visit (INDEPENDENT_AMBULATORY_CARE_PROVIDER_SITE_OTHER): Payer: Medicare HMO

## 2016-10-02 ENCOUNTER — Ambulatory Visit (INDEPENDENT_AMBULATORY_CARE_PROVIDER_SITE_OTHER): Payer: Medicare HMO | Admitting: Podiatry

## 2016-10-02 DIAGNOSIS — M79672 Pain in left foot: Secondary | ICD-10-CM

## 2016-10-02 DIAGNOSIS — M7752 Other enthesopathy of left foot: Secondary | ICD-10-CM | POA: Diagnosis not present

## 2016-10-02 DIAGNOSIS — M722 Plantar fascial fibromatosis: Secondary | ICD-10-CM

## 2016-10-02 DIAGNOSIS — M79671 Pain in right foot: Secondary | ICD-10-CM | POA: Diagnosis not present

## 2016-10-02 MED ORDER — MELOXICAM 15 MG PO TABS: 15.0000 mg | ORAL_TABLET | Freq: Every day | ORAL | 1 refills | Status: AC

## 2016-10-04 NOTE — Progress Notes (Signed)
   Subjective: Patient with PMHx of T2DM presents today for intermittent pain and tenderness in the right foot. Patient states the foot pain has been hurting for several weeks now. Patient states that it hurts in the mornings with the first steps out of bed and walking. Patient presents today for further treatment and evaluation  Objective: Physical Exam General: The patient is alert and oriented x3 in no acute distress.  Dermatology: Skin is warm, dry and supple bilateral lower extremities. Negative for open lesions or macerations bilateral.   Vascular: Dorsalis Pedis and Posterior Tibial pulses palpable bilateral.  Capillary fill time is immediate to all digits.  Neurological: Epicritic and protective threshold intact bilateral.   Musculoskeletal: Tenderness to palpation at the medial calcaneal tubercale and through the insertion of the plantar fascia of the right foot. Left foot with tenderness to palpation along the 5th MPJ. All other joints range of motion within normal limits bilateral. Strength 5/5 in all groups bilateral.   Radiographic exam: Normal osseous mineralization. Joint spaces preserved. No fracture/dislocation/boney destruction. Calcaneal spur present with mild thickening of plantar fascia right. No other soft tissue abnormalities or radiopaque foreign bodies.   Assessment: 1. Plantar fasciitis right 2. Pain in right foot 3. 5th MPJ capsulitis left  Plan of Care:  1. Patient evaluated. Xrays reviewed.   2. Injection of 0.5cc Celestone soluspan injected into the right heel at the insertion of the plantar fascia.  3. Instructed patient regarding therapies and modalities at home to alleviate symptoms.  4. Rx for Meloxicam 15mg  PO dispensed   5. Plantar fascial band(s) dispensed. 6. Injection of 0.5cc Celestone soluspan injected into the left fifth MPJ. 7. Return to clinic in 4 weeks.   Edrick Kins, DPM [facility]  Dr. Edrick Kins, [address]                                        [place], [place] [address]                Office [phone]  Fax [phone]

## 2016-10-06 MED ORDER — BETAMETHASONE SOD PHOS & ACET 6 (3-3) MG/ML IJ SUSP: 3.0000 mg | Freq: Once | INTRAMUSCULAR | Status: DC

## 2016-10-30 ENCOUNTER — Ambulatory Visit (INDEPENDENT_AMBULATORY_CARE_PROVIDER_SITE_OTHER): Payer: Medicare HMO | Admitting: Podiatry

## 2016-10-30 ENCOUNTER — Encounter: Payer: Self-pay | Admitting: Podiatry

## 2016-10-30 DIAGNOSIS — M722 Plantar fascial fibromatosis: Secondary | ICD-10-CM

## 2016-10-30 DIAGNOSIS — M7752 Other enthesopathy of left foot: Secondary | ICD-10-CM | POA: Diagnosis not present

## 2016-10-31 NOTE — Progress Notes (Signed)
   Subjective: Patient with PMHx of T2DM presents today for follow-up evaluation of intermittent pain and tenderness in the right foot. He states she  Been wearing the fascial brace which has helped provides some relief of the pain. She also reports relief after the injections at previous visit. She states she stopped taking the meloxicam because it was causing her to have headaches. Patient presents today for further treatment and evaluation  Objective: Physical Exam General: The patient is alert and oriented x3 in no acute distress.  Dermatology: Skin is warm, dry and supple bilateral lower extremities. Negative for open lesions or macerations bilateral.   Vascular: Dorsalis Pedis and Posterior Tibial pulses palpable bilateral.  Capillary fill time is immediate to all digits.  Neurological: Epicritic and protective threshold intact bilateral.   Musculoskeletal: Tenderness to palpation at the medial calcaneal tubercale and through the insertion of the plantar fascia of the right foot. Left foot with tenderness to palpation along the 5th MPJ. All other joints range of motion within normal limits bilateral. Strength 5/5 in all groups bilateral.    Assessment: 1. Plantar fasciitis right 2. Pain in right foot 3. 5th MPJ capsulitis left  Plan of Care:  1. Patient evaluated. 2. Injection of 0.5cc Celestone soluspan injected into the right heel at the insertion of the plantar fascia.  3. Injection of 0.5 mLs Celestone Soluspan injected into eft fifth MPJ 4. Continue wearing plantar fasciitis brace and taking Aleve when necessary. 5. Discontinue meloxicam secondary to headaches. 6. Return to clinic when necessary.   Edrick Kins, DPM [facility]  Dr. Edrick Kins, [address]                                        [place], [place] [address]                Office [phone]  Fax [phone]

## 2016-11-05 MED ORDER — BETAMETHASONE SOD PHOS & ACET 6 (3-3) MG/ML IJ SUSP: 3.0000 mg | Freq: Once | INTRAMUSCULAR | Status: DC

## 2016-11-12 DIAGNOSIS — H35361 Drusen (degenerative) of macula, right eye: Secondary | ICD-10-CM | POA: Diagnosis not present

## 2016-11-12 DIAGNOSIS — E113293 Type 2 diabetes mellitus with mild nonproliferative diabetic retinopathy without macular edema, bilateral: Secondary | ICD-10-CM | POA: Diagnosis not present

## 2016-11-12 DIAGNOSIS — H34832 Tributary (branch) retinal vein occlusion, left eye, with macular edema: Secondary | ICD-10-CM | POA: Diagnosis not present

## 2016-11-13 DIAGNOSIS — R69 Illness, unspecified: Secondary | ICD-10-CM | POA: Diagnosis not present

## 2016-11-20 DIAGNOSIS — H34832 Tributary (branch) retinal vein occlusion, left eye, with macular edema: Secondary | ICD-10-CM | POA: Diagnosis not present

## 2017-01-03 DIAGNOSIS — H34832 Tributary (branch) retinal vein occlusion, left eye, with macular edema: Secondary | ICD-10-CM | POA: Diagnosis not present

## 2017-01-03 DIAGNOSIS — E113293 Type 2 diabetes mellitus with mild nonproliferative diabetic retinopathy without macular edema, bilateral: Secondary | ICD-10-CM | POA: Diagnosis not present

## 2017-01-03 DIAGNOSIS — H35361 Drusen (degenerative) of macula, right eye: Secondary | ICD-10-CM | POA: Diagnosis not present

## 2017-01-31 DIAGNOSIS — H34832 Tributary (branch) retinal vein occlusion, left eye, with macular edema: Secondary | ICD-10-CM | POA: Diagnosis not present

## 2017-01-31 DIAGNOSIS — H35361 Drusen (degenerative) of macula, right eye: Secondary | ICD-10-CM | POA: Diagnosis not present

## 2017-01-31 DIAGNOSIS — E113293 Type 2 diabetes mellitus with mild nonproliferative diabetic retinopathy without macular edema, bilateral: Secondary | ICD-10-CM | POA: Diagnosis not present

## 2017-02-11 DIAGNOSIS — Z23 Encounter for immunization: Secondary | ICD-10-CM | POA: Diagnosis not present

## 2017-02-11 DIAGNOSIS — Z7984 Long term (current) use of oral hypoglycemic drugs: Secondary | ICD-10-CM | POA: Diagnosis not present

## 2017-02-11 DIAGNOSIS — M792 Neuralgia and neuritis, unspecified: Secondary | ICD-10-CM | POA: Diagnosis not present

## 2017-02-11 DIAGNOSIS — E1165 Type 2 diabetes mellitus with hyperglycemia: Secondary | ICD-10-CM | POA: Diagnosis not present

## 2017-02-11 DIAGNOSIS — J309 Allergic rhinitis, unspecified: Secondary | ICD-10-CM | POA: Diagnosis not present

## 2017-02-11 DIAGNOSIS — I10 Essential (primary) hypertension: Secondary | ICD-10-CM | POA: Diagnosis not present

## 2017-02-11 DIAGNOSIS — E039 Hypothyroidism, unspecified: Secondary | ICD-10-CM | POA: Diagnosis not present

## 2017-03-28 DIAGNOSIS — H34832 Tributary (branch) retinal vein occlusion, left eye, with macular edema: Secondary | ICD-10-CM | POA: Diagnosis not present

## 2017-03-28 DIAGNOSIS — H35361 Drusen (degenerative) of macula, right eye: Secondary | ICD-10-CM | POA: Diagnosis not present

## 2017-03-28 DIAGNOSIS — E113293 Type 2 diabetes mellitus with mild nonproliferative diabetic retinopathy without macular edema, bilateral: Secondary | ICD-10-CM | POA: Diagnosis not present

## 2017-05-06 DIAGNOSIS — H524 Presbyopia: Secondary | ICD-10-CM | POA: Diagnosis not present

## 2017-06-11 DIAGNOSIS — E113293 Type 2 diabetes mellitus with mild nonproliferative diabetic retinopathy without macular edema, bilateral: Secondary | ICD-10-CM | POA: Diagnosis not present

## 2017-06-11 DIAGNOSIS — H34832 Tributary (branch) retinal vein occlusion, left eye, with macular edema: Secondary | ICD-10-CM | POA: Diagnosis not present

## 2017-06-11 DIAGNOSIS — H35361 Drusen (degenerative) of macula, right eye: Secondary | ICD-10-CM | POA: Diagnosis not present

## 2017-07-15 ENCOUNTER — Other Ambulatory Visit: Payer: Self-pay | Admitting: Family Medicine

## 2017-07-15 DIAGNOSIS — Z139 Encounter for screening, unspecified: Secondary | ICD-10-CM

## 2017-08-11 DIAGNOSIS — R42 Dizziness and giddiness: Secondary | ICD-10-CM | POA: Diagnosis not present

## 2017-08-11 DIAGNOSIS — I10 Essential (primary) hypertension: Secondary | ICD-10-CM | POA: Diagnosis not present

## 2017-08-11 DIAGNOSIS — Z7984 Long term (current) use of oral hypoglycemic drugs: Secondary | ICD-10-CM | POA: Diagnosis not present

## 2017-08-11 DIAGNOSIS — E039 Hypothyroidism, unspecified: Secondary | ICD-10-CM | POA: Diagnosis not present

## 2017-08-11 DIAGNOSIS — Z1159 Encounter for screening for other viral diseases: Secondary | ICD-10-CM | POA: Diagnosis not present

## 2017-08-11 DIAGNOSIS — M792 Neuralgia and neuritis, unspecified: Secondary | ICD-10-CM | POA: Diagnosis not present

## 2017-08-11 DIAGNOSIS — L723 Sebaceous cyst: Secondary | ICD-10-CM | POA: Diagnosis not present

## 2017-08-11 DIAGNOSIS — E11311 Type 2 diabetes mellitus with unspecified diabetic retinopathy with macular edema: Secondary | ICD-10-CM | POA: Diagnosis not present

## 2017-08-11 DIAGNOSIS — J309 Allergic rhinitis, unspecified: Secondary | ICD-10-CM | POA: Diagnosis not present

## 2017-08-18 ENCOUNTER — Ambulatory Visit
Admission: RE | Admit: 2017-08-18 | Discharge: 2017-08-18 | Disposition: A | Discharge status: Home / Self Care | Payer: Commercial Managed Care - HMO | Source: Ambulatory Visit | Attending: Family Medicine | Admitting: Family Medicine

## 2017-08-18 DIAGNOSIS — Z139 Encounter for screening, unspecified: Secondary | ICD-10-CM

## 2017-08-18 DIAGNOSIS — Z1231 Encounter for screening mammogram for malignant neoplasm of breast: Secondary | ICD-10-CM | POA: Diagnosis not present

## 2017-08-20 DIAGNOSIS — H35361 Drusen (degenerative) of macula, right eye: Secondary | ICD-10-CM | POA: Diagnosis not present

## 2017-08-20 DIAGNOSIS — E113293 Type 2 diabetes mellitus with mild nonproliferative diabetic retinopathy without macular edema, bilateral: Secondary | ICD-10-CM | POA: Diagnosis not present

## 2017-08-20 DIAGNOSIS — H3562 Retinal hemorrhage, left eye: Secondary | ICD-10-CM | POA: Diagnosis not present

## 2017-08-20 DIAGNOSIS — H34832 Tributary (branch) retinal vein occlusion, left eye, with macular edema: Secondary | ICD-10-CM | POA: Diagnosis not present

## 2017-08-28 DIAGNOSIS — Z1211 Encounter for screening for malignant neoplasm of colon: Secondary | ICD-10-CM | POA: Diagnosis not present

## 2017-08-28 DIAGNOSIS — K640 First degree hemorrhoids: Secondary | ICD-10-CM | POA: Diagnosis not present

## 2017-11-05 DIAGNOSIS — H34832 Tributary (branch) retinal vein occlusion, left eye, with macular edema: Secondary | ICD-10-CM | POA: Diagnosis not present

## 2017-11-05 DIAGNOSIS — E113293 Type 2 diabetes mellitus with mild nonproliferative diabetic retinopathy without macular edema, bilateral: Secondary | ICD-10-CM | POA: Diagnosis not present

## 2017-11-05 DIAGNOSIS — H31091 Other chorioretinal scars, right eye: Secondary | ICD-10-CM | POA: Diagnosis not present

## 2017-11-05 DIAGNOSIS — H35361 Drusen (degenerative) of macula, right eye: Secondary | ICD-10-CM | POA: Diagnosis not present

## 2017-11-26 ENCOUNTER — Other Ambulatory Visit: Payer: Self-pay | Admitting: Family Medicine

## 2017-11-26 ENCOUNTER — Ambulatory Visit
Admission: RE | Admit: 2017-11-26 | Discharge: 2017-11-26 | Disposition: A | Discharge status: Home / Self Care | Source: Ambulatory Visit | Attending: Family Medicine | Admitting: Family Medicine

## 2017-11-26 DIAGNOSIS — Z9181 History of falling: Secondary | ICD-10-CM

## 2017-11-26 DIAGNOSIS — R52 Pain, unspecified: Secondary | ICD-10-CM

## 2017-11-26 DIAGNOSIS — M25552 Pain in left hip: Secondary | ICD-10-CM | POA: Diagnosis not present

## 2017-11-26 DIAGNOSIS — S79912A Unspecified injury of left hip, initial encounter: Secondary | ICD-10-CM | POA: Diagnosis not present

## 2017-12-08 DIAGNOSIS — M1612 Unilateral primary osteoarthritis, left hip: Secondary | ICD-10-CM | POA: Diagnosis not present

## 2017-12-10 DIAGNOSIS — M25552 Pain in left hip: Secondary | ICD-10-CM | POA: Diagnosis not present

## 2017-12-10 DIAGNOSIS — M1612 Unilateral primary osteoarthritis, left hip: Secondary | ICD-10-CM | POA: Diagnosis not present

## 2017-12-16 DIAGNOSIS — M1612 Unilateral primary osteoarthritis, left hip: Secondary | ICD-10-CM | POA: Diagnosis not present

## 2017-12-16 DIAGNOSIS — M25552 Pain in left hip: Secondary | ICD-10-CM | POA: Diagnosis not present

## 2017-12-18 DIAGNOSIS — M25552 Pain in left hip: Secondary | ICD-10-CM | POA: Diagnosis not present

## 2017-12-18 DIAGNOSIS — M1612 Unilateral primary osteoarthritis, left hip: Secondary | ICD-10-CM | POA: Diagnosis not present

## 2017-12-23 DIAGNOSIS — M1612 Unilateral primary osteoarthritis, left hip: Secondary | ICD-10-CM | POA: Diagnosis not present

## 2017-12-23 DIAGNOSIS — M25552 Pain in left hip: Secondary | ICD-10-CM | POA: Diagnosis not present

## 2017-12-25 DIAGNOSIS — M25552 Pain in left hip: Secondary | ICD-10-CM | POA: Diagnosis not present

## 2017-12-25 DIAGNOSIS — M1612 Unilateral primary osteoarthritis, left hip: Secondary | ICD-10-CM | POA: Diagnosis not present

## 2017-12-30 DIAGNOSIS — M25552 Pain in left hip: Secondary | ICD-10-CM | POA: Diagnosis not present

## 2017-12-30 DIAGNOSIS — M1612 Unilateral primary osteoarthritis, left hip: Secondary | ICD-10-CM | POA: Diagnosis not present

## 2018-01-01 DIAGNOSIS — M25552 Pain in left hip: Secondary | ICD-10-CM | POA: Diagnosis not present

## 2018-01-01 DIAGNOSIS — M1612 Unilateral primary osteoarthritis, left hip: Secondary | ICD-10-CM | POA: Diagnosis not present

## 2018-01-05 DIAGNOSIS — M1612 Unilateral primary osteoarthritis, left hip: Secondary | ICD-10-CM | POA: Diagnosis not present

## 2018-01-05 DIAGNOSIS — M25552 Pain in left hip: Secondary | ICD-10-CM | POA: Diagnosis not present

## 2018-01-12 DIAGNOSIS — M1612 Unilateral primary osteoarthritis, left hip: Secondary | ICD-10-CM | POA: Diagnosis not present

## 2018-01-12 DIAGNOSIS — M25552 Pain in left hip: Secondary | ICD-10-CM | POA: Diagnosis not present

## 2018-02-11 DIAGNOSIS — H34832 Tributary (branch) retinal vein occlusion, left eye, with macular edema: Secondary | ICD-10-CM | POA: Diagnosis not present

## 2018-02-11 DIAGNOSIS — H35361 Drusen (degenerative) of macula, right eye: Secondary | ICD-10-CM | POA: Diagnosis not present

## 2018-02-11 DIAGNOSIS — E113291 Type 2 diabetes mellitus with mild nonproliferative diabetic retinopathy without macular edema, right eye: Secondary | ICD-10-CM | POA: Diagnosis not present

## 2018-02-11 DIAGNOSIS — H3582 Retinal ischemia: Secondary | ICD-10-CM | POA: Diagnosis not present

## 2018-02-16 DIAGNOSIS — Z23 Encounter for immunization: Secondary | ICD-10-CM | POA: Diagnosis not present

## 2018-02-16 DIAGNOSIS — M792 Neuralgia and neuritis, unspecified: Secondary | ICD-10-CM | POA: Diagnosis not present

## 2018-02-16 DIAGNOSIS — R42 Dizziness and giddiness: Secondary | ICD-10-CM | POA: Diagnosis not present

## 2018-02-16 DIAGNOSIS — I10 Essential (primary) hypertension: Secondary | ICD-10-CM | POA: Diagnosis not present

## 2018-02-16 DIAGNOSIS — Z7984 Long term (current) use of oral hypoglycemic drugs: Secondary | ICD-10-CM | POA: Diagnosis not present

## 2018-02-16 DIAGNOSIS — J309 Allergic rhinitis, unspecified: Secondary | ICD-10-CM | POA: Diagnosis not present

## 2018-02-16 DIAGNOSIS — E119 Type 2 diabetes mellitus without complications: Secondary | ICD-10-CM | POA: Diagnosis not present

## 2018-02-16 DIAGNOSIS — E039 Hypothyroidism, unspecified: Secondary | ICD-10-CM | POA: Diagnosis not present

## 2018-03-06 DIAGNOSIS — E871 Hypo-osmolality and hyponatremia: Secondary | ICD-10-CM | POA: Diagnosis not present

## 2018-04-07 DIAGNOSIS — M25561 Pain in right knee: Secondary | ICD-10-CM | POA: Diagnosis not present

## 2018-04-29 DIAGNOSIS — M25561 Pain in right knee: Secondary | ICD-10-CM | POA: Diagnosis not present

## 2018-05-08 DIAGNOSIS — M25561 Pain in right knee: Secondary | ICD-10-CM | POA: Diagnosis not present

## 2018-05-11 DIAGNOSIS — M25561 Pain in right knee: Secondary | ICD-10-CM | POA: Diagnosis not present

## 2018-05-18 DIAGNOSIS — I10 Essential (primary) hypertension: Secondary | ICD-10-CM | POA: Diagnosis not present

## 2018-05-18 DIAGNOSIS — E11311 Type 2 diabetes mellitus with unspecified diabetic retinopathy with macular edema: Secondary | ICD-10-CM | POA: Diagnosis not present

## 2018-05-29 DIAGNOSIS — E113291 Type 2 diabetes mellitus with mild nonproliferative diabetic retinopathy without macular edema, right eye: Secondary | ICD-10-CM | POA: Diagnosis not present

## 2018-05-29 DIAGNOSIS — H34832 Tributary (branch) retinal vein occlusion, left eye, with macular edema: Secondary | ICD-10-CM | POA: Diagnosis not present

## 2018-05-29 DIAGNOSIS — H2513 Age-related nuclear cataract, bilateral: Secondary | ICD-10-CM | POA: Diagnosis not present

## 2018-05-29 DIAGNOSIS — H35361 Drusen (degenerative) of macula, right eye: Secondary | ICD-10-CM | POA: Diagnosis not present

## 2018-06-09 DIAGNOSIS — E871 Hypo-osmolality and hyponatremia: Secondary | ICD-10-CM | POA: Diagnosis not present

## 2018-06-09 DIAGNOSIS — I10 Essential (primary) hypertension: Secondary | ICD-10-CM | POA: Diagnosis not present

## 2018-06-11 DIAGNOSIS — M2241 Chondromalacia patellae, right knee: Secondary | ICD-10-CM | POA: Diagnosis not present

## 2018-06-11 DIAGNOSIS — G8918 Other acute postprocedural pain: Secondary | ICD-10-CM | POA: Diagnosis not present

## 2018-06-11 DIAGNOSIS — S83231A Complex tear of medial meniscus, current injury, right knee, initial encounter: Secondary | ICD-10-CM | POA: Diagnosis not present

## 2018-06-19 DIAGNOSIS — M25661 Stiffness of right knee, not elsewhere classified: Secondary | ICD-10-CM | POA: Diagnosis not present

## 2018-06-19 DIAGNOSIS — M25561 Pain in right knee: Secondary | ICD-10-CM | POA: Diagnosis not present

## 2018-06-19 DIAGNOSIS — M6281 Muscle weakness (generalized): Secondary | ICD-10-CM | POA: Diagnosis not present

## 2018-06-23 DIAGNOSIS — M6281 Muscle weakness (generalized): Secondary | ICD-10-CM | POA: Diagnosis not present

## 2018-06-23 DIAGNOSIS — M25661 Stiffness of right knee, not elsewhere classified: Secondary | ICD-10-CM | POA: Diagnosis not present

## 2018-06-26 DIAGNOSIS — M25661 Stiffness of right knee, not elsewhere classified: Secondary | ICD-10-CM | POA: Diagnosis not present

## 2018-06-26 DIAGNOSIS — M6281 Muscle weakness (generalized): Secondary | ICD-10-CM | POA: Diagnosis not present

## 2018-06-26 DIAGNOSIS — M1711 Unilateral primary osteoarthritis, right knee: Secondary | ICD-10-CM | POA: Diagnosis not present

## 2018-06-30 DIAGNOSIS — M25661 Stiffness of right knee, not elsewhere classified: Secondary | ICD-10-CM | POA: Diagnosis not present

## 2018-06-30 DIAGNOSIS — M6281 Muscle weakness (generalized): Secondary | ICD-10-CM | POA: Diagnosis not present

## 2018-07-02 DIAGNOSIS — M25661 Stiffness of right knee, not elsewhere classified: Secondary | ICD-10-CM | POA: Diagnosis not present

## 2018-07-02 DIAGNOSIS — M6281 Muscle weakness (generalized): Secondary | ICD-10-CM | POA: Diagnosis not present

## 2018-07-07 DIAGNOSIS — M6281 Muscle weakness (generalized): Secondary | ICD-10-CM | POA: Diagnosis not present

## 2018-07-07 DIAGNOSIS — M25661 Stiffness of right knee, not elsewhere classified: Secondary | ICD-10-CM | POA: Diagnosis not present

## 2018-07-10 DIAGNOSIS — M25661 Stiffness of right knee, not elsewhere classified: Secondary | ICD-10-CM | POA: Diagnosis not present

## 2018-07-10 DIAGNOSIS — M6281 Muscle weakness (generalized): Secondary | ICD-10-CM | POA: Diagnosis not present

## 2018-07-13 ENCOUNTER — Other Ambulatory Visit: Payer: Self-pay | Admitting: Family Medicine

## 2018-07-13 DIAGNOSIS — Z1231 Encounter for screening mammogram for malignant neoplasm of breast: Secondary | ICD-10-CM

## 2018-07-14 DIAGNOSIS — M25661 Stiffness of right knee, not elsewhere classified: Secondary | ICD-10-CM | POA: Diagnosis not present

## 2018-07-14 DIAGNOSIS — M6281 Muscle weakness (generalized): Secondary | ICD-10-CM | POA: Diagnosis not present

## 2018-07-16 DIAGNOSIS — M6281 Muscle weakness (generalized): Secondary | ICD-10-CM | POA: Diagnosis not present

## 2018-07-16 DIAGNOSIS — M25661 Stiffness of right knee, not elsewhere classified: Secondary | ICD-10-CM | POA: Diagnosis not present

## 2018-07-21 DIAGNOSIS — M6281 Muscle weakness (generalized): Secondary | ICD-10-CM | POA: Diagnosis not present

## 2018-07-21 DIAGNOSIS — M25661 Stiffness of right knee, not elsewhere classified: Secondary | ICD-10-CM | POA: Diagnosis not present

## 2018-07-23 DIAGNOSIS — M6281 Muscle weakness (generalized): Secondary | ICD-10-CM | POA: Diagnosis not present

## 2018-07-23 DIAGNOSIS — M25661 Stiffness of right knee, not elsewhere classified: Secondary | ICD-10-CM | POA: Diagnosis not present

## 2018-07-28 DIAGNOSIS — M6281 Muscle weakness (generalized): Secondary | ICD-10-CM | POA: Diagnosis not present

## 2018-07-28 DIAGNOSIS — M25661 Stiffness of right knee, not elsewhere classified: Secondary | ICD-10-CM | POA: Diagnosis not present

## 2018-07-30 ENCOUNTER — Encounter (HOSPITAL_COMMUNITY): Payer: Self-pay | Admitting: Emergency Medicine

## 2018-07-30 ENCOUNTER — Emergency Department (HOSPITAL_COMMUNITY): Payer: Medicare HMO

## 2018-07-30 ENCOUNTER — Observation Stay (HOSPITAL_COMMUNITY)
Admission: EM | Admit: 2018-07-30 | Discharge: 2018-07-31 | Disposition: A | Discharge status: Home / Self Care | Payer: Medicare HMO | Attending: Internal Medicine | Admitting: Internal Medicine

## 2018-07-30 ENCOUNTER — Other Ambulatory Visit: Payer: Self-pay

## 2018-07-30 DIAGNOSIS — R4781 Slurred speech: Secondary | ICD-10-CM | POA: Diagnosis not present

## 2018-07-30 DIAGNOSIS — E162 Hypoglycemia, unspecified: Secondary | ICD-10-CM

## 2018-07-30 DIAGNOSIS — E119 Type 2 diabetes mellitus without complications: Secondary | ICD-10-CM

## 2018-07-30 DIAGNOSIS — E161 Other hypoglycemia: Secondary | ICD-10-CM | POA: Diagnosis not present

## 2018-07-30 DIAGNOSIS — E871 Hypo-osmolality and hyponatremia: Secondary | ICD-10-CM | POA: Diagnosis not present

## 2018-07-30 DIAGNOSIS — R51 Headache: Secondary | ICD-10-CM | POA: Insufficient documentation

## 2018-07-30 DIAGNOSIS — R531 Weakness: Principal | ICD-10-CM

## 2018-07-30 DIAGNOSIS — C50919 Malignant neoplasm of unspecified site of unspecified female breast: Secondary | ICD-10-CM | POA: Diagnosis not present

## 2018-07-30 DIAGNOSIS — E039 Hypothyroidism, unspecified: Secondary | ICD-10-CM | POA: Diagnosis not present

## 2018-07-30 DIAGNOSIS — I10 Essential (primary) hypertension: Secondary | ICD-10-CM | POA: Diagnosis not present

## 2018-07-30 DIAGNOSIS — M722 Plantar fascial fibromatosis: Secondary | ICD-10-CM

## 2018-07-30 DIAGNOSIS — M7752 Other enthesopathy of left foot: Secondary | ICD-10-CM

## 2018-07-30 DIAGNOSIS — R519 Headache, unspecified: Secondary | ICD-10-CM

## 2018-07-30 DIAGNOSIS — R29810 Facial weakness: Secondary | ICD-10-CM

## 2018-07-30 DIAGNOSIS — M79671 Pain in right foot: Secondary | ICD-10-CM

## 2018-07-30 HISTORY — DX: Other complications of anesthesia, initial encounter: T88.59XA

## 2018-07-30 HISTORY — DX: Weakness: R53.1

## 2018-07-30 HISTORY — DX: Nausea with vomiting, unspecified: R11.2

## 2018-07-30 HISTORY — DX: Other specified postprocedural states: Z98.890

## 2018-07-30 HISTORY — DX: Malignant (primary) neoplasm, unspecified: C80.1

## 2018-07-30 HISTORY — DX: Adverse effect of unspecified anesthetic, initial encounter: T41.45XA

## 2018-07-30 LAB — CBC
HCT: 38.3 % (ref 36.0–46.0)
HEMOGLOBIN: 12.6 g/dL (ref 12.0–15.0)
MCH: 27 pg (ref 26.0–34.0)
MCHC: 32.9 g/dL (ref 30.0–36.0)
MCV: 82.2 fL (ref 80.0–100.0)
Platelets: 342 10*3/uL (ref 150–400)
RBC: 4.66 MIL/uL (ref 3.87–5.11)
RDW: 12.6 % (ref 11.5–15.5)
WBC: 4.6 10*3/uL (ref 4.0–10.5)
nRBC: 0 % (ref 0.0–0.2)

## 2018-07-30 LAB — URINALYSIS, ROUTINE W REFLEX MICROSCOPIC
BILIRUBIN URINE: NEGATIVE
Glucose, UA: 50 mg/dL — AB
Hgb urine dipstick: NEGATIVE
Ketones, ur: NEGATIVE mg/dL
Leukocytes,Ua: NEGATIVE
Nitrite: NEGATIVE
Protein, ur: NEGATIVE mg/dL
Specific Gravity, Urine: 1.004 — ABNORMAL LOW (ref 1.005–1.030)
pH: 7 (ref 5.0–8.0)

## 2018-07-30 LAB — GLUCOSE, CAPILLARY
Glucose-Capillary: 154 mg/dL — ABNORMAL HIGH (ref 70–99)
Glucose-Capillary: 110 mg/dL — ABNORMAL HIGH (ref 70–99)

## 2018-07-30 LAB — COMPREHENSIVE METABOLIC PANEL
ALK PHOS: 48 U/L (ref 38–126)
ALT: 17 U/L (ref 0–44)
AST: 23 U/L (ref 15–41)
Albumin: 4.2 g/dL (ref 3.5–5.0)
Anion gap: 15 (ref 5–15)
BUN: 10 mg/dL (ref 8–23)
CALCIUM: 9.9 mg/dL (ref 8.9–10.3)
CO2: 23 mmol/L (ref 22–32)
Chloride: 89 mmol/L — ABNORMAL LOW (ref 98–111)
Creatinine, Ser: 1.03 mg/dL — ABNORMAL HIGH (ref 0.44–1.00)
GFR calc Af Amer: >60 mL/min (ref >60)
GFR calc non Af Amer: 54 mL/min — ABNORMAL LOW (ref >60)
Glucose, Bld: 91 mg/dL (ref 70–99)
Potassium: 4 mmol/L (ref 3.5–5.1)
Sodium: 127 mmol/L — ABNORMAL LOW (ref 135–145)
Total Bilirubin: 0.8 mg/dL (ref 0.3–1.2)
Total Protein: 7 g/dL (ref 6.5–8.1)

## 2018-07-30 LAB — HEMOGLOBIN A1C
Hgb A1c MFr Bld: 6.6 % — ABNORMAL HIGH (ref 4.8–5.6)
Mean Plasma Glucose: 142.72 mg/dL

## 2018-07-30 LAB — TSH: TSH: 1.665 u[IU]/mL (ref 0.350–4.500)

## 2018-07-30 LAB — CBG MONITORING, ED: Glucose-Capillary: 83 mg/dL (ref 70–99)

## 2018-07-30 MED ORDER — METFORMIN HCL 500 MG PO TABS
500.0000 mg | ORAL_TABLET | Freq: Two times a day (BID) | ORAL | Status: DC
  Administered 2018-07-30 – 2018-07-31 (×2): 500 mg via ORAL
  Filled 2018-07-30 (×2): qty 1

## 2018-07-30 MED ORDER — GLIPIZIDE ER 10 MG PO TB24: 10.0000 mg | ORAL_TABLET | Freq: Every day | ORAL | Status: DC

## 2018-07-30 MED ORDER — ATORVASTATIN CALCIUM 10 MG PO TABS
10.0000 mg | ORAL_TABLET | Freq: Every day | ORAL | Status: DC
  Administered 2018-07-30 – 2018-07-31 (×2): 10 mg via ORAL
  Filled 2018-07-30 (×2): qty 1

## 2018-07-30 MED ORDER — PROCHLORPERAZINE EDISYLATE 10 MG/2ML IJ SOLN
5.0000 mg | Freq: Once | INTRAMUSCULAR | Status: AC
  Administered 2018-07-30: 5 mg via INTRAVENOUS
  Filled 2018-07-30: qty 2

## 2018-07-30 MED ORDER — ENOXAPARIN SODIUM 40 MG/0.4ML ~~LOC~~ SOLN
40.0000 mg | SUBCUTANEOUS | Status: DC
  Administered 2018-07-30: 40 mg via SUBCUTANEOUS
  Filled 2018-07-30: qty 0.4

## 2018-07-30 MED ORDER — SODIUM CHLORIDE 0.9 % IV BOLUS
500.0000 mL | Freq: Once | INTRAVENOUS | Status: AC
  Administered 2018-07-30: 500 mL via INTRAVENOUS

## 2018-07-30 MED ORDER — ACETAMINOPHEN 500 MG PO TABS: 500.0000 mg | ORAL_TABLET | Freq: Four times a day (QID) | ORAL | Status: DC | PRN

## 2018-07-30 MED ORDER — IBUPROFEN 200 MG PO TABS: 400.0000 mg | ORAL_TABLET | Freq: Four times a day (QID) | ORAL | Status: DC | PRN

## 2018-07-30 MED ORDER — GABAPENTIN 300 MG PO CAPS
300.0000 mg | ORAL_CAPSULE | Freq: Every day | ORAL | Status: DC
  Administered 2018-07-30: 300 mg via ORAL
  Filled 2018-07-30: qty 1

## 2018-07-30 MED ORDER — INSULIN ASPART 100 UNIT/ML ~~LOC~~ SOLN: 0.0000 [IU] | Freq: Three times a day (TID) | SUBCUTANEOUS | Status: DC

## 2018-07-30 MED ORDER — LEVOTHYROXINE SODIUM 50 MCG PO TABS
50.0000 ug | ORAL_TABLET | Freq: Every day | ORAL | Status: DC
  Administered 2018-07-31: 50 ug via ORAL
  Filled 2018-07-30: qty 1

## 2018-07-30 MED ORDER — ASPIRIN EC 81 MG PO TBEC
81.0000 mg | DELAYED_RELEASE_TABLET | Freq: Every day | ORAL | Status: DC
  Administered 2018-07-30 – 2018-07-31 (×2): 81 mg via ORAL
  Filled 2018-07-30 (×2): qty 1

## 2018-07-30 MED ORDER — AMLODIPINE BESYLATE 5 MG PO TABS
5.0000 mg | ORAL_TABLET | Freq: Every day | ORAL | Status: DC
  Administered 2018-07-30 – 2018-07-31 (×2): 5 mg via ORAL
  Filled 2018-07-30 (×2): qty 1

## 2018-07-30 MED ORDER — ONDANSETRON HCL 4 MG/2ML IJ SOLN
4.0000 mg | Freq: Once | INTRAMUSCULAR | Status: AC
  Administered 2018-07-30: 4 mg via INTRAVENOUS
  Filled 2018-07-30: qty 2

## 2018-07-30 MED ORDER — SODIUM CHLORIDE 0.9 % IV SOLN
INTRAVENOUS | Status: DC
  Administered 2018-07-30 – 2018-07-31 (×2): via INTRAVENOUS

## 2018-07-30 MED ORDER — DIPHENHYDRAMINE HCL 50 MG/ML IJ SOLN
25.0000 mg | Freq: Once | INTRAMUSCULAR | Status: AC
  Administered 2018-07-30: 25 mg via INTRAVENOUS
  Filled 2018-07-30: qty 1

## 2018-07-30 NOTE — Evaluation (Signed)
Physical Therapy Evaluation Patient Details Name: [name] MRN: [id number] DOB: 23-Jan-1946 Today's Date: 07/30/2018   History of Present Illness  Pt is a 73 y/o female admitted secondary to sudden onset weakness. Work up pending. MRI negative for acute abnormality. Pt also with hyponatremia. PMH includes HTN, DM, and breast cancer.   Clinical Impression  Pt admitted secondary to problem above with deficits below. Pt with cautious gait and mild unsteadiness. Required min guard A for mobility tasks without AD. Reports she was going to outpatient PT following knee surgery, so recommend return to outpatient PT at d/c. Will continue to follow acutely to maximize functional mobility independence and safety.     Follow Up Recommendations Outpatient PT;Supervision for mobility/OOB(continuation of outpatient PT )    Equipment Recommendations  None recommended by PT    Recommendations for Other Services       Precautions / Restrictions Precautions Precautions: Fall Restrictions Weight Bearing Restrictions: No      Mobility  Bed Mobility Overal bed mobility: Modified Independent                Transfers Overall transfer level: Needs assistance Equipment used: None Transfers: Sit to/from Stand Sit to Stand: Min guard         General transfer comment: Min guard for safety.   Ambulation/Gait Ambulation/Gait assistance: Min guard Gait Distance (Feet): 10 Feet Assistive device: None Gait Pattern/deviations: Step-through pattern;Decreased stride length Gait velocity: Decreased    General Gait Details: Slow, waddle type gait. Pt food tray in room, so ambulated around bed to chair. Pt reaching for bed rail for stability, however, pt reports more for comfort. Min guard for steadying assist.   Stairs            Wheelchair Mobility    Modified Rankin (Stroke Patients Only)       Balance Overall balance assessment: Needs assistance Sitting-balance support:  No upper extremity supported;Feet supported Sitting balance-Leahy Scale: Good     Standing balance support: No upper extremity supported;During functional activity Standing balance-Leahy Scale: Fair                               Pertinent Vitals/Pain Pain Assessment: No/denies pain    Home Living Family/patient expects to be discharged to:: Private residence Living Arrangements: Spouse/significant other Available Help at Discharge: Family Type of Home: House Home Access: Stairs to enter Entrance Stairs-Rails: Right;Left;Can reach both Entrance Stairs-Number of Steps: 3 Home Layout: One level Home Equipment: Environmental consultant - 2 wheels;Cane - single point      Prior Function Level of Independence: Independent         Comments: Reports she was independent with ambulation      Hand Dominance        Extremity/Trunk Assessment   Upper Extremity Assessment Upper Extremity Assessment: Defer to OT evaluation    Lower Extremity Assessment Lower Extremity Assessment: Generalized weakness    Cervical / Trunk Assessment Cervical / Trunk Assessment: Normal  Communication   Communication: No difficulties  Cognition Arousal/Alertness: Awake/alert Behavior During Therapy: WFL for tasks assessed/performed Overall Cognitive Status: Within Functional Limits for tasks assessed                                        General Comments General comments (skin integrity, edema, etc.): Pt reports she was going to  outpatient PT following knee surgery in beginning of January.     Exercises     Assessment/Plan    PT Assessment Patient needs continued PT services  PT Problem List Decreased strength;Decreased balance;Decreased mobility       PT Treatment Interventions Gait training;Stair training;Functional mobility training;Therapeutic activities;Therapeutic exercise;Balance training;Patient/family education    PT Goals (Current goals can be found in the  Care Plan section)  Acute Rehab PT Goals Patient Stated Goal: to go home  PT Goal Formulation: With patient Time For Goal Achievement: 08/13/18 Potential to Achieve Goals: Good    Frequency Min 3X/week   Barriers to discharge        Co-evaluation               AM-PAC PT "6 Clicks" Mobility  Outcome Measure Help needed turning from your back to your side while in a flat bed without using bedrails?: None Help needed moving from lying on your back to sitting on the side of a flat bed without using bedrails?: None Help needed moving to and from a bed to a chair (including a wheelchair)?: A Little Help needed standing up from a chair using your arms (e.g., wheelchair or bedside chair)?: A Little Help needed to walk in hospital room?: A Little Help needed climbing 3-5 steps with a railing? : A Little 6 Click Score: 20    End of Session Equipment Utilized During Treatment: Gait belt Activity Tolerance: Patient tolerated treatment well Patient left: in chair;with call bell/phone within reach;with chair alarm set Nurse Communication: Mobility status PT Visit Diagnosis: Muscle weakness (generalized) (M62.81);Other abnormalities of gait and mobility (R26.89)    Time: 1561-5379 PT Time Calculation (min) (ACUTE ONLY): 20 min   Charges:   PT Evaluation $PT Eval Low Complexity: Hood, PT, DPT  Acute Rehabilitation Services  Pager: [phone] Office: [phone]   Rudean Hitt 07/30/2018, 4:41 PM

## 2018-07-30 NOTE — ED Notes (Signed)
Pt returned from MRI °

## 2018-07-30 NOTE — ED Triage Notes (Signed)
Pt arrives via EMS complaining of feeling weak this morning, dizzy, and has a headache. EMS reports no facial droop, no slurred speech, no one sided weakness. CBG 60. Pt ate a sandwich which improved CBG 70. Pt continues to have headache at this time. BP 210/110. Denies dizziness at this time. HR 90-100 SR

## 2018-07-30 NOTE — ED Provider Notes (Signed)
[facility] Emergency Department Provider Note MRN:  [id number]  Arrival date & time: 07/30/18     Chief Complaint   Headache   History of Present Illness   [name] is a 73 y.o. year-old female with a history of diabetes, hypertension presenting to the ED with chief complaint of headache.  Patient was feeling well yesterday, went to bed feeling normal.  Woke up at 2 AM feeling groggy, disoriented, went back to sleep.  Woke up again at about 8 AM, feeling generalized weakness, unable to get out of bed on her own.  Endorsing dull, mild diffuse headache, denies fever, no vomiting, no chest pain or shortness of breath, no abdominal pain, no dysuria, no numbness weakness to arms or legs.  Review of Systems  A complete 10 system review of systems was obtained and all systems are negative except as noted in the HPI and PMH.   Patient's Health History    Past Medical History:  Diagnosis Date  . Diabetes mellitus without complication ([place])   . Hypertension   . Personal history of chemotherapy 1999  . Personal history of radiation therapy 2000    Past Surgical History:  Procedure Laterality Date  . BREAST BIOPSY Left 2015  . BREAST BIOPSY Right 2009  . BREAST LUMPECTOMY Right 1999    History reviewed. No pertinent family history.  Social History   Socioeconomic History  . Marital status: Married    Spouse name: Not on file  . Number of children: Not on file  . Years of education: Not on file  . Highest education level: Not on file  Occupational History  . Not on file  Social Needs  . Financial resource strain: Not on file  . Food insecurity:    Worry: Not on file    Inability: Not on file  . Transportation needs:    Medical: Not on file    Non-medical: Not on file  Tobacco Use  . Smoking status: Never Smoker  . Smokeless tobacco: Never Used  Substance and Sexual Activity  . Alcohol use: Not Currently  . Drug use: Never  . Sexual activity:  Not on file  Lifestyle  . Physical activity:    Days per week: Not on file    Minutes per session: Not on file  . Stress: Not on file  Relationships  . Social connections:    Talks on phone: Not on file    Gets together: Not on file    Attends religious service: Not on file    Active member of club or organization: Not on file    Attends meetings of clubs or organizations: Not on file    Relationship status: Not on file  . Intimate partner violence:    Fear of current or ex partner: Not on file    Emotionally abused: Not on file    Physically abused: Not on file    Forced sexual activity: Not on file  Other Topics Concern  . Not on file  Social History Narrative  . Not on file     Physical Exam  Vital Signs and Nursing Notes reviewed Vitals:   07/30/18 0945 07/30/18 1045  BP: (!) 160/58 (!) 165/83  Pulse: 77 67  Resp: 16 14  Temp:    SpO2: 98% 99%    CONSTITUTIONAL: Well-appearing, NAD NEURO:  Alert and oriented x 3, normal and symmetric strength and sensation to the arms and legs, subtle right facial droop that seems  to involve the right eyebrow EYES:  eyes equal and reactive ENT/NECK:  no LAD, no JVD CARDIO: Regular rate, well-perfused, normal S1 and S2 PULM:  CTAB no wheezing or rhonchi GI/GU:  normal bowel sounds, non-distended, non-tender MSK/SPINE:  No gross deformities, no edema SKIN:  no rash, atraumatic PSYCH:  Appropriate speech and behavior  Diagnostic and Interventional Summary    EKG Interpretation  Date/Time:  Thursday July 30 2018 08:59:40 EST Ventricular Rate:  93 PR Interval:    QRS Duration: 98 QT Interval:  347 QTC Calculation: 432 R Axis:   21 Text Interpretation:  Sinus rhythm Confirmed by Gerlene Fee [phone]) on 07/30/2018 10:39:22 AM      Labs Reviewed  COMPREHENSIVE METABOLIC PANEL - Abnormal; Notable for the following components:      Result Value   Sodium 127 (*)    Chloride 89 (*)    Creatinine, Ser 1.03 (*)    GFR calc  non Af Amer 54 (*)    All other components within normal limits  CBC  URINALYSIS, ROUTINE W REFLEX MICROSCOPIC  CBG MONITORING, ED    CT HEAD WO CONTRAST  Final Result    MR BRAIN WO CONTRAST    (Results Pending)    Medications  diphenhydrAMINE (BENADRYL) injection 25 mg (25 mg Intravenous Given 07/30/18 0928)  prochlorperazine (COMPAZINE) injection 5 mg (5 mg Intravenous Given 07/30/18 0927)  sodium chloride 0.9 % bolus 500 mL (0 mLs Intravenous Stopped 07/30/18 1055)  ondansetron (ZOFRAN) injection 4 mg (4 mg Intravenous Given 07/30/18 1054)     Procedures  Emergency Ultrasound Study:   Angiocath insertion Performed by: Maudie Flakes  Consent: Verbal consent obtained. Risks and benefits: risks, benefits and alternatives were discussed Immediately prior to procedure the correct patient, procedure, equipment, support staff and site/side marked as needed.  Indication: difficult IV access Preparation: Patient was prepped and draped in the usual sterile fashion. Vein Location: The left brachial vein was visualized during assessment for potential access sites and was found to be patent/ easily compressed with linear ultrasound.  The needle was visualized with real-time ultrasound and guided into the vein. Gauge: 20  Images were not saved due to the time sensitive nature of the situation. Normal blood return.  Patient tolerance: Patient tolerated the procedure well with no immediate complications.  Critical Care  ED Course and Medical Decision Making  I have reviewed the triage vital signs and the nursing notes.  Pertinent labs & imaging results that were available during my care of the patient were reviewed by me and considered in my medical decision making (see below for details).  Considering metabolic disarray, UTI, intracranial bleeding in this 73 year old female with generalized weakness, on exam has a subtle facial droop on the right, patient has no idea when this began,  seems to involve the forehead which would suggest a peripheral process.  Last known normal unknown but would be either 2 AM or yesterday, which would be outside of the window for code stroke activation.  Labs, CT pending.  Clinical Course as of Jul 30 1140  Thu Jul 30, 2018  1055 Upon reassessment patient's headache and nausea are largely resolved.  Labs have revealed hyponatremia as a possible etiology for generalized weakness.  Patient continues to have a subtle right facial droop, on this repeat evaluation does not seem to involve the forehead.  Still patient has no idea when this began, explains that she has not looked in the mirror today, her husband has  not seen her since last night, so an estimated last known normal of 8 or 9 PM.  MRI ordered to evaluate or exclude acute ischemic stroke.  Will admit to hospitalist service.   [MB]    Clinical Course User Index [MB] Sedonia Small Barth Kirks, MD      Barth Kirks. Sedonia Small, [place] [email or web address]  Final Clinical Impressions(s) / ED Diagnoses     ICD-10-CM   1. Acute nonintractable headache, unspecified headache type R51   2. Facial weakness R29.810   3. Hyponatremia E87.1     ED Discharge Orders    None         Maudie Flakes, MD 07/30/18 1143

## 2018-07-30 NOTE — H&P (Signed)
History and Physical    [name] PRF:[id number] DOB: 1946-05-14 DOA: 07/30/2018  PCP: Alroy Dust, L.[name], MD  Patient coming from: home   I have personally briefly reviewed patient's old medical records available.   Chief Complaint: Sudden onset of weakness.  HPI: [name] is a 73 y.o. female with medical history significant of type 2 diabetes on oral hypoglycemics, hypertension, history of breast cancer with history of chemo and radiation who presented to the emergency room with sudden onset weakness in the morning.  According to the patient, she was at her usual state of health until last night.  She even went to gym without trouble.  She woke up at about 2 AM feeling groggy and disoriented went back to sleep.  At about 730 or 8 AM she woke up, she could not gather herself up, felt generalized weakness and was unable to walk.  Had mild, generalized headache that has improved now.  She denied any visual changes.  Denies any nausea vomiting, vertigo, dizziness or lightheadedness.  Denied any focal weakness.  Husband denied noticing any facial droop.  Husband called EMS and patient was brought to the ER.  She might have some slurred speech.  Her blood sugar was 60 at home.  Blood pressures were 210/110. Patient denies any fever or chills.  Denies any recent flulike symptoms. Patient does endorse history of hyponatremia as she remembers told by her primary care physician.  Does not remember the number. Patient is on indapamide that is a thiazide diuretic. ED Course: Blood pressure slightly elevated 165/83.  Otherwise hemodynamically stable.  No neurological deficit found.  Sodium 127 and chloride 89.  CT head showed chronic changes.  Underwent MRI of the brain that shows chronic vascular disease, no acute findings.  Patient currently no neurological deficits.  Given 500 mL of normal saline in the ER.  Review of Systems: As per HPI otherwise 10 point review of systems negative.     Past Medical History:  Diagnosis Date  . Diabetes mellitus without complication ([place])   . Hypertension   . Personal history of chemotherapy 1999  . Personal history of radiation therapy 2000    Past Surgical History:  Procedure Laterality Date  . BREAST BIOPSY Left 2015  . BREAST BIOPSY Right 2009  . BREAST LUMPECTOMY Right 1999     reports that she has never smoked. She has never used smokeless tobacco. She reports previous alcohol use. She reports that she does not use drugs.  Allergies  Allergen Reactions  . Codeine     "makes me feel funny"   . Meloxicam     Headache     History reviewed. No pertinent family history.   Prior to Admission medications   Medication Sig Start Date End Date Taking? Authorizing Provider  acetaminophen (TYLENOL) 500 MG tablet Take 500 mg by mouth every 6 (six) hours as needed for moderate pain or headache.   Yes [provider]  aspirin EC 81 MG tablet Take 81 mg by mouth daily.   Yes [provider]  atorvastatin (LIPITOR) 10 MG tablet Take 10 mg by mouth daily.   Yes [provider]  calcium carbonate (OS-CAL) 600 MG TABS tablet Take 600 mg of elemental calcium by mouth daily with breakfast.   Yes [provider]  fexofenadine (ALLEGRA) 180 MG tablet Take 180 mg by mouth as needed for allergies or rhinitis.   Yes [provider]  gabapentin (NEURONTIN) 300 MG capsule Take 300  mg by mouth at bedtime.  07/16/16  Yes [provider]  glipiZIDE (GLUCOTROL XL) 10 MG 24 hr tablet Take 10 mg by mouth daily with breakfast.  08/07/16  Yes [provider]  ibuprofen (ADVIL,MOTRIN) 200 MG tablet Take 400 mg by mouth every 6 (six) hours as needed for mild pain or moderate pain.   Yes [provider]  indapamide (LOZOL) 1.25 MG tablet Take 1.25 mg by mouth daily.   Yes [provider]  levothyroxine (SYNTHROID, LEVOTHROID) 50 MCG tablet Take 50 mcg by mouth daily before  breakfast.  09/14/16  Yes [provider]  metFORMIN (GLUCOPHAGE) 500 MG tablet Take 500 mg by mouth 2 (two) times daily with a meal.  08/03/16  Yes [provider]  vitamin E (VITAMIN E) 400 UNIT capsule Take 400 Units by mouth daily.   Yes [provider]  Alcohol Swabs (B-D SINGLE USE SWABS REGULAR) PADS  08/29/16   [provider]    Physical Exam: Vitals:   07/30/18 0900 07/30/18 0915 07/30/18 0945 07/30/18 1045  BP: (!) 199/106 (!) 191/94 (!) 160/58 (!) 165/83  Pulse: 91 93 77 67  Resp: 13 16 16 14   Temp: 98.4 F (36.9 C)     TempSrc: Oral     SpO2: 100% 98% 98% 99%  Weight:      Height:        Constitutional: NAD, calm, comfortable Vitals:   07/30/18 0900 07/30/18 0915 07/30/18 0945 07/30/18 1045  BP: (!) 199/106 (!) 191/94 (!) 160/58 (!) 165/83  Pulse: 91 93 77 67  Resp: 13 16 16 14   Temp: 98.4 F (36.9 C)     TempSrc: Oral     SpO2: 100% 98% 98% 99%  Weight:      Height:       Eyes: PERRL, lids and conjunctivae normal ENMT: Mucous membranes are moist. Posterior pharynx clear of any exudate or lesions.Normal dentition.  Neck: normal, supple, no masses, no thyromegaly Respiratory: clear to auscultation bilaterally, no wheezing, no crackles. Normal respiratory effort. No accessory muscle use.  Cardiovascular: Regular rate and rhythm, no murmurs / rubs / gallops. No extremity edema. 2+ pedal pulses. No carotid bruits.  Abdomen: no tenderness, no masses palpated. No hepatosplenomegaly. Bowel sounds positive.  Musculoskeletal: no clubbing / cyanosis. No joint deformity upper and lower extremities. Good ROM, no contractures. Normal muscle tone.  Skin: no rashes, lesions, ulcers. No induration Neurologic: CN 2-12 grossly intact. Sensation intact, DTR normal. Strength 5/5 in all 4.  Psychiatric: Normal judgment and insight. Alert and oriented x 3. Normal mood.     Labs on Admission: I have personally reviewed following labs and imaging  studies  CBC: Recent Labs  Lab 07/30/18 0922  WBC 4.6  HGB 12.6  HCT 38.3  MCV 82.2  PLT 710   Basic Metabolic Panel: Recent Labs  Lab 07/30/18 0922  NA 127*  K 4.0  CL 89*  CO2 23  GLUCOSE 91  BUN 10  CREATININE 1.03*  CALCIUM 9.9   GFR: Estimated Creatinine Clearance: 50.7 mL/min (A) (by C-G formula based on SCr of 1.03 mg/dL (H)). Liver Function Tests: Recent Labs  Lab 07/30/18 0922  AST 23  ALT 17  ALKPHOS 48  BILITOT 0.8  PROT 7.0  ALBUMIN 4.2   No results for input(s): LIPASE, AMYLASE in the last 168 hours. No results for input(s): AMMONIA in the last 168 hours. Coagulation Profile: No results for input(s): INR, PROTIME in the last  168 hours. Cardiac Enzymes: No results for input(s): CKTOTAL, CKMB, CKMBINDEX, TROPONINI in the last 168 hours. BNP (last 3 results) No results for input(s): PROBNP in the last 8760 hours. HbA1C: No results for input(s): HGBA1C in the last 72 hours. CBG: Recent Labs  Lab 07/30/18 0901  GLUCAP 83   Lipid Profile: No results for input(s): CHOL, HDL, LDLCALC, TRIG, CHOLHDL, LDLDIRECT in the last 72 hours. Thyroid Function Tests: No results for input(s): TSH, T4TOTAL, FREET4, T3FREE, THYROIDAB in the last 72 hours. Anemia Panel: No results for input(s): VITAMINB12, FOLATE, FERRITIN, TIBC, IRON, RETICCTPCT in the last 72 hours. Urine analysis: No results found for: COLORURINE, APPEARANCEUR, LABSPEC, [place], GLUCOSEU, HGBUR, BILIRUBINUR, KETONESUR, PROTEINUR, UROBILINOGEN, NITRITE, LEUKOCYTESUR  Radiological Exams on Admission: Ct Head Wo Contrast  Result Date: 07/30/2018 CLINICAL DATA:  Headache and dizziness EXAM: CT HEAD WITHOUT CONTRAST TECHNIQUE: Contiguous axial images were obtained from the base of the skull through the vertex without intravenous contrast. COMPARISON:  None. FINDINGS: Brain: The ventricles are normal in size and configuration. There is no intracranial mass, hemorrhage, extra-axial fluid collection,  or midline shift. There is slight small vessel disease in the centra semiovale bilaterally. Brain parenchyma elsewhere appears unremarkable. There is no evident acute infarct. Vascular: There is no hyperdense vessel. There is calcification in each carotid siphon. Skull: The bony calvarium appears intact. There is evident bony overgrowth posterior to the odontoid in the upper cervical region, not causing appreciable stenosis. Sinuses/Orbits: There is mucosal thickening in several ethmoid air cells. Other visualized paranasal sinuses are clear. Orbits appear symmetric bilaterally. Other: Mastoid air cells are clear. IMPRESSION: Mild periventricular small vessel disease. No acute infarct. No mass or hemorrhage. Foci of arterial vascular calcification noted. Bony overgrowth posterior to the odontoid is noted in the upper cervical region, not causing significant stenosis in the upper cervical region. There is mucosal thickening in several ethmoid air cells. Electronically Signed   By: Lowella Grip III M.D.   On: 07/30/2018 10:29   Mr Brain Wo Contrast  Result Date: 07/30/2018 CLINICAL DATA:  Chief complaint of headache. Breast cancer. Diabetes. Hypertension. No fever. Generalized weakness. EXAM: MRI HEAD WITHOUT CONTRAST TECHNIQUE: Multiplanar, multiecho pulse sequences of the brain and surrounding structures were obtained without intravenous contrast. COMPARISON:  CT head earlier today. Also report from CT cervical spine 01/18/2003. FINDINGS: Brain: No evidence for acute infarction, hemorrhage, mass lesion, hydrocephalus, or extra-axial fluid. Generalized atrophy. T2 and FLAIR hyperintensities throughout the periventricular and subcortical white matter, consistent with small vessel disease or post treatment effect, from chemoradiation. Scattered areas of lacunar infarction affect the deep white matter. Prominent perivascular spaces are noted, secondary to hypertension. Vascular: Flow voids are maintained. LEFT  vertebral is dominant. RIGHT vertebral appears to terminate in PICA. Skull and upper cervical spine: Normal skull base. Calcific pannus prominently noted in the ventral cervicomedullary subarachnoid space, there is no cord compression. Prominent calcification of the transverse ligament and structures around C1 was described in 2004, and is likely to be relatively stable over the 15 year interval. Sinuses/Orbits: No layering sinus fluid or significant opacity. Negative orbits. Other: No significant middle ear or mastoid fluid. IMPRESSION: Atrophy and small vessel disease.  No acute intracranial findings. Good general agreement with prior CT. No intracranial cause for headache is seen. Prominent calcific pannus, without cervicomedullary compression, was noted on prior report from 2004. Electronically Signed   By: Staci Righter M.D.   On: 07/30/2018 12:29    EKG: Independently reviewed.  Sinus rhythm.  No acute ST-T wave changes.  Assessment/Plan Active Problems:   Weakness   Hyponatremia   Essential hypertension   Type 2 diabetes mellitus without complication (H[facility])     1.  Generalized weakness: Sudden onset generalized weakness and cause unknown.  No evidence of acute stroke or TIA. Observation in monitored unit.  Check orthostatic blood pressures every 8 hours.  Maintenance IV fluids with isotonic saline.  PT and OT evaluation. Resume aspirin, statin and antihypertensives.  2.  Hyponatremia and hypochloremia: Patient is on thiazide diuretics with indapamide that can exacerbate hyponatremia.  Also suspect from compulsive water drinking.  Advised free water restriction to 1.5 L a day.  Will gently hydrate with isotonic saline and monitor levels. Will discontinue indapamide and start on amlodipine.  3.  Hypothyroidism: On Synthroid.  Will continue.  Will check TSH.  4.  Type 2 diabetes: On oral hypoglycemics.  Continue.  Will check A1c.  DVT prophylaxis: Lovenox. Code Status: Full code. Family  Communication: Husband at the bedside. Disposition Plan: Home. Consults called: None. Admission status: Observation.   Barb Merino MD [facility] Pager [phone]  If 7PM-7AM, please contact night-coverage [email or web address] Password [email or web address]  07/30/2018, 1:20 PM

## 2018-07-30 NOTE — ED Notes (Signed)
Pt states she woke up approx 7am and felt like she couldn't move her arms/legs or get her words out. Pt urinated on herself in the bed. Pt states her husband came to help her. She laid back down and finally started feeling better when EMS arrived.

## 2018-07-30 NOTE — Progress Notes (Signed)
Patient arrived to 63w04. A&O x4, family at bedside. Tele has been placed. Nurse will continue to monitor. Raymond

## 2018-07-30 NOTE — ED Notes (Signed)
Pt transported to MRI 

## 2018-07-31 DIAGNOSIS — E162 Hypoglycemia, unspecified: Secondary | ICD-10-CM

## 2018-07-31 DIAGNOSIS — E119 Type 2 diabetes mellitus without complications: Secondary | ICD-10-CM | POA: Diagnosis not present

## 2018-07-31 DIAGNOSIS — E871 Hypo-osmolality and hyponatremia: Secondary | ICD-10-CM

## 2018-07-31 DIAGNOSIS — R531 Weakness: Secondary | ICD-10-CM | POA: Diagnosis not present

## 2018-07-31 LAB — GLUCOSE, CAPILLARY
Glucose-Capillary: 69 mg/dL — ABNORMAL LOW (ref 70–99)
Glucose-Capillary: 63 mg/dL — ABNORMAL LOW (ref 70–99)
Glucose-Capillary: 64 mg/dL — ABNORMAL LOW (ref 70–99)
Glucose-Capillary: 81 mg/dL (ref 70–99)

## 2018-07-31 LAB — LIPID PANEL
Cholesterol: 116 mg/dL (ref 0–200)
HDL: 58 mg/dL (ref >40)
LDL Cholesterol: 47 mg/dL (ref 0–99)
TRIGLYCERIDES: 54 mg/dL (ref <150)
Total CHOL/HDL Ratio: 2 RATIO
VLDL: 11 mg/dL (ref 0–40)

## 2018-07-31 LAB — BASIC METABOLIC PANEL
Anion gap: 10 (ref 5–15)
BUN: 9 mg/dL (ref 8–23)
CO2: 24 mmol/L (ref 22–32)
Calcium: 9 mg/dL (ref 8.9–10.3)
Chloride: 99 mmol/L (ref 98–111)
Creatinine, Ser: 0.95 mg/dL (ref 0.44–1.00)
GFR calc Af Amer: >60 mL/min (ref >60)
GFR calc non Af Amer: 60 mL/min — ABNORMAL LOW (ref >60)
Glucose, Bld: 84 mg/dL (ref 70–99)
Potassium: 4 mmol/L (ref 3.5–5.1)
Sodium: 133 mmol/L — ABNORMAL LOW (ref 135–145)

## 2018-07-31 NOTE — Care Management Note (Signed)
Case Management Note  Patient Details  Name: [name] MRN: [id number] Date of Birth: 1945/10/22  Subjective/Objective:   Pt in with weakness. She is from home with her spouse. No DME at home. Pt denies issues with home medications and transportation.                 Action/Plan: No f/u per PT and no DME needs. Pt discharging home with self care. Pt has hospital f/u and transport home.  Expected Discharge Date:  07/31/18               Expected Discharge Plan:  Home/Self Care  In-House Referral:     Discharge planning Services     Post Acute Care Choice:    Choice offered to:     DME Arranged:    DME Agency:     HH Arranged:    HH Agency:     Status of Service:  Completed, signed off  If discussed at [employer] of Stay Meetings, dates discussed:    Additional Comments:  Pollie Friar, RN 07/31/2018, 10:35 AM

## 2018-07-31 NOTE — Progress Notes (Signed)
Physical Therapy Treatment Patient Details Name: [name] MRN: [id number] DOB: Jul 06, 1945 Today's Date: 07/31/2018    History of Present Illness Pt is a 74 y/o female admitted secondary to sudden onset weakness. Work up pending. MRI negative for acute abnormality. Pt also with hyponatremia. PMH includes HTN, DM, and breast cancer.     PT Comments    Pt making steady progress with functional mobility and tolerated further gait training this session. Pt is eager to return home and start an exercise program at her gym. PT will continue to follow acutely to progress mobility as tolerated while admitted.    Follow Up Recommendations  No PT follow up     Equipment Recommendations  None recommended by PT    Recommendations for Other Services       Precautions / Restrictions Precautions Precautions: Fall Restrictions Weight Bearing Restrictions: No    Mobility  Bed Mobility               General bed mobility comments: pt OOB in recliner chair upon arrival  Transfers Overall transfer level: Needs assistance Equipment used: None Transfers: Sit to/from Stand Sit to Stand: Min guard         General transfer comment: Min guard for safety.   Ambulation/Gait Ambulation/Gait assistance: Min guard;Supervision Gait Distance (Feet): 150 Feet Assistive device: None Gait Pattern/deviations: Step-through pattern;Decreased stride length Gait velocity: Decreased    General Gait Details: mildly antalgic gait pattern with difficulty achieving full knee extension of R knee with stance phase; however, no need for UE support or physical assistance; no LOB   Stairs             Wheelchair Mobility    Modified Rankin (Stroke Patients Only)       Balance Overall balance assessment: Needs assistance Sitting-balance support: No upper extremity supported;Feet supported Sitting balance-Leahy Scale: Good     Standing balance support: No upper extremity  supported;During functional activity Standing balance-Leahy Scale: Fair                              Cognition Arousal/Alertness: Awake/alert Behavior During Therapy: WFL for tasks assessed/performed Overall Cognitive Status: Within Functional Limits for tasks assessed                                        Exercises      General Comments        Pertinent Vitals/Pain Pain Assessment: No/denies pain    Home Living                      Prior Function            PT Goals (current goals can now be found in the care plan section) Acute Rehab PT Goals PT Goal Formulation: With patient Time For Goal Achievement: 08/13/18 Potential to Achieve Goals: Good Progress towards PT goals: Progressing toward goals    Frequency    Min 3X/week      PT Plan Discharge plan needs to be updated    Co-evaluation              AM-PAC PT "6 Clicks" Mobility   Outcome Measure  Help needed turning from your back to your side while in a flat bed without using bedrails?: None Help needed moving from lying on your back  to sitting on the side of a flat bed without using bedrails?: None Help needed moving to and from a bed to a chair (including a wheelchair)?: None Help needed standing up from a chair using your arms (e.g., wheelchair or bedside chair)?: None Help needed to walk in hospital room?: A Little Help needed climbing 3-5 steps with a railing? : A Little 6 Click Score: 22    End of Session   Activity Tolerance: Patient tolerated treatment well Patient left: in chair;with call bell/phone within reach Nurse Communication: Mobility status PT Visit Diagnosis: Muscle weakness (generalized) (M62.81);Other abnormalities of gait and mobility (R26.89)     Time: 3662-9476 PT Time Calculation (min) (ACUTE ONLY): 16 min  Charges:  $Gait Training: 8-22 mins                     Sherie Don, [place], DPT  Acute Rehabilitation Services Pager  [phone] Office [place] 07/31/2018, 10:05 AM

## 2018-07-31 NOTE — Evaluation (Signed)
Occupational Therapy Evaluation Patient Details Name: [name] MRN: [id number] DOB: 12/06/1945 Today's Date: 07/31/2018    History of Present Illness Pt is a 73 y/o female admitted secondary to sudden onset weakness. Work up pending. MRI negative for acute abnormality. Pt also with hyponatremia. PMH includes HTN, DM, and breast cancer.    Clinical Impression   This 73 y/o female presents with the above. At baseline pt is independent with ADL, iADL and functional mobility, was active. Pt performing functional mobility, toileting, standing grooming ADL with overall supervision this session; demonstrates LB ADL with minguard assist. Pt reports will return home with available assist from spouse PRN, reports feeling fairly close to her baseline regarding ADL and mobility completion. Education provided throughout session and questions answered with no further acute OT needs identified at this time. Do not anticipate pt will require follow up OT services. Acute OT to sign off, thank you for this referral.     Follow Up Recommendations  No OT follow up;Supervision/Assistance - 24 hour(24hr initially)    Equipment Recommendations  None recommended by OT           Precautions / Restrictions Precautions Precautions: Fall Restrictions Weight Bearing Restrictions: No      Mobility Bed Mobility Overal bed mobility: Modified Independent             General bed mobility comments: HOB slightly elevated   Transfers Overall transfer level: Needs assistance Equipment used: None Transfers: Sit to/from Stand Sit to Stand: Min guard;Supervision         General transfer comment: For safety and immediate standing balance    Balance Overall balance assessment: Needs assistance Sitting-balance support: No upper extremity supported;Feet supported Sitting balance-Leahy Scale: Good     Standing balance support: No upper extremity supported;During functional activity Standing  balance-Leahy Scale: Fair                             ADL either performed or assessed with clinical judgement   ADL Overall ADL's : Needs assistance/impaired Eating/Feeding: Independent;Sitting   Grooming: Wash/dry hands;Oral care;Standing;Supervision/safety   Upper Body Bathing: Modified independent;Sitting   Lower Body Bathing: Supervison/ safety;Sit to/from stand   Upper Body Dressing : Modified independent;Sitting   Lower Body Dressing: Min guard;Sit to/from stand Lower Body Dressing Details (indicate cue type and reason): pt able to utilize figure 4 technique for LB ADL Toilet Transfer: Supervision/safety;Ambulation;Regular Glass blower/designer Details (indicate cue type and reason): supervision for IV pole Toileting- Clothing Manipulation and Hygiene: Supervision/safety;Sit to/from stand       Functional mobility during ADLs: Supervision/safety       Vision Baseline Vision/History: No visual deficits Patient Visual Report: (reports fuzziness initially, has since resolved) Vision Assessment?: No apparent visual deficits     Perception     Praxis      Pertinent Vitals/Pain Pain Assessment: No/denies pain     Hand Dominance     Extremity/Trunk Assessment Upper Extremity Assessment Upper Extremity Assessment: Generalized weakness   Lower Extremity Assessment Lower Extremity Assessment: Defer to PT evaluation   Cervical / Trunk Assessment Cervical / Trunk Assessment: Normal   Communication Communication Communication: No difficulties   Cognition Arousal/Alertness: Awake/alert Behavior During Therapy: WFL for tasks assessed/performed Overall Cognitive Status: Within Functional Limits for tasks assessed  General Comments       Exercises     Shoulder Instructions      Home Living Family/patient expects to be discharged to:: Private residence Living Arrangements: Spouse/significant  other Available Help at Discharge: Family Type of Home: House Home Access: Stairs to enter Technical brewer of Steps: 3 Entrance Stairs-Rails: Right;Left;Can reach both [place]: One level     Bathroom Shower/Tub: Teacher, early years/pre: Handicapped height     Home Equipment: Environmental consultant - 2 wheels;Cane - single point          Prior Functioning/Environment Level of Independence: Independent        Comments: active, driving and reports she was going to the gym        OT Problem List: Decreased activity tolerance;Decreased strength;Impaired balance (sitting and/or standing)      OT Treatment/Interventions:      OT Goals(Current goals can be found in the care plan section) Acute Rehab OT Goals Patient Stated Goal: to go home  OT Goal Formulation: All assessment and education complete, DC therapy  OT Frequency:     Barriers to D/C:            Co-evaluation              AM-PAC OT "6 Clicks" Daily Activity     Outcome Measure Help from another person eating meals?: None Help from another person taking care of personal grooming?: None Help from another person toileting, which includes using toliet, bedpan, or urinal?: None Help from another person bathing (including washing, rinsing, drying)?: None Help from another person to put on and taking off regular upper body clothing?: None Help from another person to put on and taking off regular lower body clothing?: None 6 Click Score: 24   End of Session Nurse Communication: Mobility status  Activity Tolerance: Patient tolerated treatment well Patient left: in bed;with call bell/phone within reach  OT Visit Diagnosis: Muscle weakness (generalized) (M62.81)                Time: 9798-9211 OT Time Calculation (min): 16 min Charges:  OT General Charges $OT Visit: 1 Visit OT Evaluation $OT Eval Moderate Complexity: 1 Mod  Lou Cal, OT [employer] Pager  [phone] Office [phone]   Raymondo Band 07/31/2018, 1:08 PM

## 2018-07-31 NOTE — Discharge Summary (Signed)
Discharge Summary  [name] TML:[id number] DOB: 03-Aug-1945  PCP: [name], Cassandra Sa, MD  Admit date: 07/30/2018 Discharge date: 07/31/2018  Time spent: 46mins  Recommendations for Outpatient Follow-up:  1. F/u with PCP within a week  for hospital discharge follow up, repeat cbc/bmp at follow up. pcp to monitor sodium (tendency to have low sodium) and blood glucose control (tendency to have low blood glucose) 2. Outpatient PT   Discharge Diagnoses:  Active Hospital Problems   Diagnosis Date Noted  . Weakness 07/30/2018  . Hyponatremia 07/30/2018  . Essential hypertension 07/30/2018  . Type 2 diabetes mellitus without complication ([name]) 68/05/7516    Resolved Hospital Problems  No resolved problems to display.    Discharge Condition: stable  Diet recommendation: heart healthy/carb modified  Filed Weights   07/30/18 0848  Weight: 77.1 kg    History of present illness: (per admitting MD Dr Sloan Leiter) PCP: [name], Cassandra Sa, MD  Patient coming from: home   I have personally briefly reviewed patient's old medical records available.   Chief Complaint: Sudden onset of weakness.  HPI: [name] is a 73 y.o. female with medical history significant of type 2 diabetes on oral hypoglycemics, hypertension, history of breast cancer with history of chemo and radiation who presented to the emergency room with sudden onset weakness in the morning.  According to the patient, she was at her usual state of health until last night.  She even went to gym without trouble.  She woke up at about 2 AM feeling groggy and disoriented went back to sleep.  At about 730 or 8 AM she woke up, she could not gather herself up, felt generalized weakness and was unable to walk.  Had mild, generalized headache that has improved now.  She denied any visual changes.  Denies any nausea vomiting, vertigo, dizziness or lightheadedness.  Denied any focal weakness.  Husband denied noticing any facial  droop.  Husband called EMS and patient was brought to the ER.  She might have some slurred speech.  Her blood sugar was 60 at home.  Blood pressures were 210/110. Patient denies any fever or chills.  Denies any recent flulike symptoms. Patient does endorse history of hyponatremia as she remembers told by her primary care physician.  Does not remember the number. Patient is on indapamide that is a thiazide diuretic. ED Course: Blood pressure slightly elevated 165/83.  Otherwise hemodynamically stable.  No neurological deficit found.  Sodium 127 and chloride 89.  CT head showed chronic changes.  Underwent MRI of the brain that shows chronic vascular disease, no acute findings.  Patient currently no neurological deficits.  Given 500 mL of normal saline in the ER.  Hospital Course:  Active Problems:   Weakness   Hyponatremia   Essential hypertension   Type 2 diabetes mellitus without complication (HCC)    Generalized weakness: -Most likely due to hyponatremia and hypoglycemia, could not rule out TIA ( mri brain no acute findings, but she does has risk factors) -weakness resolved after correcting hyponatremia and hypoglycemia -continue statin/asa for stroke prevention, her a1c is 6.6. bp in 120's this am  Hyponatremia: -home meds diuretics discontinued, patient also reports tendency to drink a lot of water, advised caution of excessive free water -sodium 127 on presentation, sodium improved to 133 after  Hydration and holding diuretics -repeat bmp at hospital discharge follow up.  Hypoglycemia: -her blood glucose was 60 by EMS report -discontinue home meds glipizide due to tendency to cause hypoglycemia  noninsulin  dependent DM2 a1c 6.6 -has hypoglycemia episodes -discontinue glipizide, continue metformin -follow up with pcp  HTN She reports not able to take lisinopril due to cough, she reports was taken off norvasc in the past due to edema Discontinue diuretics due to  hyponatremia Her blood pressure was elevated on presentation could be due to stress, sbp this am is 114 She is advised to check her blood pressure at home, bring blood pressure record for her pcp to review, further bp meds adjustment per pcp  HLD; continue stain  Hypothyroidism: continue synthroid  Procedures:  MRI brain  Consultations:  none  Discharge Exam: BP 114/77 (BP Location: Left Arm)   Pulse 97   Temp 98 F (36.7 C) (Oral)   Resp 17   Ht 5\' 5"  (1.651 m)   Wt 77.1 kg   SpO2 97%   BMI 28.29 kg/m   General: NAD Cardiovascular: RRR Respiratory: CTABL  Discharge Instructions You were cared for by a hospitalist during your hospital stay. If you have any questions about your discharge medications or the care you received while you were in the hospital after you are discharged, you can call the unit and asked to speak with the hospitalist on call if the hospitalist that took care of you is not available. Once you are discharged, your primary care physician will handle any further medical issues. Please note that NO REFILLS for any discharge medications will be authorized once you are discharged, as it is imperative that you return to your primary care physician (or establish a relationship with a primary care physician if you do not have one) for your aftercare needs so that they can reassess your need for medications and monitor your lab values.  Discharge Instructions    Diet general   Complete by:  As directed    Increase activity slowly   Complete by:  As directed      Allergies as of 07/31/2018      Reactions   Codeine    "makes me feel funny"    Meloxicam    Headache       Medication List    STOP taking these medications   glipiZIDE 10 MG 24 hr tablet Commonly known as:  GLUCOTROL XL   indapamide 1.25 MG tablet Commonly known as:  LOZOL     TAKE these medications   acetaminophen 500 MG tablet Commonly known as:  TYLENOL Take 500 mg by mouth every 6  (six) hours as needed for moderate pain or headache.   aspirin EC 81 MG tablet Take 81 mg by mouth daily.   atorvastatin 10 MG tablet Commonly known as:  LIPITOR Take 10 mg by mouth daily.   B-D SINGLE USE SWABS REGULAR Pads   calcium carbonate 600 MG Tabs tablet Commonly known as:  OS-CAL Take 600 mg of elemental calcium by mouth daily with breakfast.   fexofenadine 180 MG tablet Commonly known as:  ALLEGRA Take 180 mg by mouth as needed for allergies or rhinitis.   gabapentin 300 MG capsule Commonly known as:  NEURONTIN Take 300 mg by mouth at bedtime.   ibuprofen 200 MG tablet Commonly known as:  ADVIL,MOTRIN Take 400 mg by mouth every 6 (six) hours as needed for mild pain or moderate pain.   levothyroxine 50 MCG tablet Commonly known as:  SYNTHROID, LEVOTHROID Take 50 mcg by mouth daily before breakfast.   metFORMIN 500 MG tablet Commonly known as:  GLUCOPHAGE Take 500 mg by mouth 2 (two)  times daily with a meal.   vitamin E 400 UNIT capsule Generic drug:  vitamin E Take 400 Units by mouth daily.      Allergies  Allergen Reactions  . Codeine     "makes me feel funny"   . Meloxicam     Headache    Follow-up Information    [name], Cassandra Sa, MD Follow up in 1 week(s).   Specialty:  Family Medicine Why:  hospital discharge follow up, repeat labx ( bmp) at hospital follow up. please continue to check your blood pressure and blood sugar at home , bring in record for your doctor to review.  Contact information: [address] [place] [address] [phone]        continue outpatient physical therapy Follow up.            The results of significant diagnostics from this hospitalization (including imaging, microbiology, ancillary and laboratory) are listed below for reference.    Significant Diagnostic Studies: Ct Head Wo Contrast  Result Date: 07/30/2018 CLINICAL DATA:  Headache and dizziness EXAM: CT HEAD WITHOUT CONTRAST  TECHNIQUE: Contiguous axial images were obtained from the base of the skull through the vertex without intravenous contrast. COMPARISON:  None. FINDINGS: Brain: The ventricles are normal in size and configuration. There is no intracranial mass, hemorrhage, extra-axial fluid collection, or midline shift. There is slight small vessel disease in the centra semiovale bilaterally. Brain parenchyma elsewhere appears unremarkable. There is no evident acute infarct. Vascular: There is no hyperdense vessel. There is calcification in each carotid siphon. Skull: The bony calvarium appears intact. There is evident bony overgrowth posterior to the odontoid in the upper cervical region, not causing appreciable stenosis. Sinuses/Orbits: There is mucosal thickening in several ethmoid air cells. Other visualized paranasal sinuses are clear. Orbits appear symmetric bilaterally. Other: Mastoid air cells are clear. IMPRESSION: Mild periventricular small vessel disease. No acute infarct. No mass or hemorrhage. Foci of arterial vascular calcification noted. Bony overgrowth posterior to the odontoid is noted in the upper cervical region, not causing significant stenosis in the upper cervical region. There is mucosal thickening in several ethmoid air cells. Electronically Signed   By: Lowella Grip III M.D.   On: 07/30/2018 10:29   Mr Brain Wo Contrast  Result Date: 07/30/2018 CLINICAL DATA:  Chief complaint of headache. Breast cancer. Diabetes. Hypertension. No fever. Generalized weakness. EXAM: MRI HEAD WITHOUT CONTRAST TECHNIQUE: Multiplanar, multiecho pulse sequences of the brain and surrounding structures were obtained without intravenous contrast. COMPARISON:  CT head earlier today. Also report from CT cervical spine 01/18/2003. FINDINGS: Brain: No evidence for acute infarction, hemorrhage, mass lesion, hydrocephalus, or extra-axial fluid. Generalized atrophy. T2 and FLAIR hyperintensities throughout the periventricular and  subcortical white matter, consistent with small vessel disease or post treatment effect, from chemoradiation. Scattered areas of lacunar infarction affect the deep white matter. Prominent perivascular spaces are noted, secondary to hypertension. Vascular: Flow voids are maintained. LEFT vertebral is dominant. RIGHT vertebral appears to terminate in PICA. Skull and upper cervical spine: Normal skull base. Calcific pannus prominently noted in the ventral cervicomedullary subarachnoid space, there is no cord compression. Prominent calcification of the transverse ligament and structures around C1 was described in 2004, and is likely to be relatively stable over the 15 year interval. Sinuses/Orbits: No layering sinus fluid or significant opacity. Negative orbits. Other: No significant middle ear or mastoid fluid. IMPRESSION: Atrophy and small vessel disease.  No acute intracranial findings. Good general agreement with prior CT.  No intracranial cause for headache is seen. Prominent calcific pannus, without cervicomedullary compression, was noted on prior report from 2004. Electronically Signed   By: Staci Righter M.D.   On: 07/30/2018 12:29    Microbiology: No results found for this or any previous visit (from the past 240 hour(s)).   Labs: Basic Metabolic Panel: Recent Labs  Lab 07/30/18 0922 07/31/18 0407  NA 127* 133*  K 4.0 4.0  CL 89* 99  CO2 23 24  GLUCOSE 91 84  BUN 10 9  CREATININE 1.03* 0.95  CALCIUM 9.9 9.0   Liver Function Tests: Recent Labs  Lab 07/30/18 0922  AST 23  ALT 17  ALKPHOS 48  BILITOT 0.8  PROT 7.0  ALBUMIN 4.2   No results for input(s): LIPASE, AMYLASE in the last 168 hours. No results for input(s): AMMONIA in the last 168 hours. CBC: Recent Labs  Lab 07/30/18 0922  WBC 4.6  HGB 12.6  HCT 38.3  MCV 82.2  PLT 342   Cardiac Enzymes: No results for input(s): CKTOTAL, CKMB, CKMBINDEX, TROPONINI in the last 168 hours. BNP: BNP (last 3 results) No results  for input(s): BNP in the last 8760 hours.  ProBNP (last 3 results) No results for input(s): PROBNP in the last 8760 hours.  CBG: Recent Labs  Lab 07/30/18 2131 07/31/18 0622 07/31/18 0702 07/31/18 0709 07/31/18 0719  GLUCAP 110* 69* 63* 64* 81       Signed:  Florencia Reasons MD, PhD  [facility] 07/31/2018, 11:05 AM

## 2018-07-31 NOTE — Progress Notes (Signed)
Patient education provided to patient and husband. IV and tele discontinued without complication. Patient discharged from facility via wheelchair.

## 2018-08-06 DIAGNOSIS — M25661 Stiffness of right knee, not elsewhere classified: Secondary | ICD-10-CM | POA: Diagnosis not present

## 2018-08-06 DIAGNOSIS — M6281 Muscle weakness (generalized): Secondary | ICD-10-CM | POA: Diagnosis not present

## 2018-08-07 DIAGNOSIS — Z7984 Long term (current) use of oral hypoglycemic drugs: Secondary | ICD-10-CM | POA: Diagnosis not present

## 2018-08-07 DIAGNOSIS — I10 Essential (primary) hypertension: Secondary | ICD-10-CM | POA: Diagnosis not present

## 2018-08-07 DIAGNOSIS — E11311 Type 2 diabetes mellitus with unspecified diabetic retinopathy with macular edema: Secondary | ICD-10-CM | POA: Diagnosis not present

## 2018-08-07 DIAGNOSIS — E871 Hypo-osmolality and hyponatremia: Secondary | ICD-10-CM | POA: Diagnosis not present

## 2018-08-12 DIAGNOSIS — M25661 Stiffness of right knee, not elsewhere classified: Secondary | ICD-10-CM | POA: Diagnosis not present

## 2018-08-12 DIAGNOSIS — Z9889 Other specified postprocedural states: Secondary | ICD-10-CM | POA: Diagnosis not present

## 2018-08-12 DIAGNOSIS — M6281 Muscle weakness (generalized): Secondary | ICD-10-CM | POA: Diagnosis not present

## 2018-08-14 DIAGNOSIS — M25661 Stiffness of right knee, not elsewhere classified: Secondary | ICD-10-CM | POA: Diagnosis not present

## 2018-08-14 DIAGNOSIS — M6281 Muscle weakness (generalized): Secondary | ICD-10-CM | POA: Diagnosis not present

## 2018-08-21 ENCOUNTER — Ambulatory Visit
Admission: RE | Admit: 2018-08-21 | Discharge: 2018-08-21 | Disposition: A | Discharge status: Home / Self Care | Source: Ambulatory Visit | Attending: Family Medicine | Admitting: Family Medicine

## 2018-08-21 ENCOUNTER — Other Ambulatory Visit: Payer: Self-pay

## 2018-08-21 DIAGNOSIS — Z1231 Encounter for screening mammogram for malignant neoplasm of breast: Secondary | ICD-10-CM

## 2018-08-25 DIAGNOSIS — H524 Presbyopia: Secondary | ICD-10-CM | POA: Diagnosis not present

## 2018-09-03 DIAGNOSIS — E871 Hypo-osmolality and hyponatremia: Secondary | ICD-10-CM | POA: Diagnosis not present

## 2018-09-03 DIAGNOSIS — I10 Essential (primary) hypertension: Secondary | ICD-10-CM | POA: Diagnosis not present

## 2018-09-03 DIAGNOSIS — R609 Edema, unspecified: Secondary | ICD-10-CM | POA: Diagnosis not present

## 2018-09-16 DIAGNOSIS — H2513 Age-related nuclear cataract, bilateral: Secondary | ICD-10-CM | POA: Diagnosis not present

## 2018-09-16 DIAGNOSIS — H34832 Tributary (branch) retinal vein occlusion, left eye, with macular edema: Secondary | ICD-10-CM | POA: Diagnosis not present

## 2018-09-16 DIAGNOSIS — H35361 Drusen (degenerative) of macula, right eye: Secondary | ICD-10-CM | POA: Diagnosis not present

## 2018-09-16 DIAGNOSIS — E113291 Type 2 diabetes mellitus with mild nonproliferative diabetic retinopathy without macular edema, right eye: Secondary | ICD-10-CM | POA: Diagnosis not present

## 2018-09-28 DIAGNOSIS — Z0000 Encounter for general adult medical examination without abnormal findings: Secondary | ICD-10-CM | POA: Diagnosis not present

## 2018-10-14 DIAGNOSIS — M1711 Unilateral primary osteoarthritis, right knee: Secondary | ICD-10-CM | POA: Diagnosis not present

## 2018-10-21 DIAGNOSIS — M1711 Unilateral primary osteoarthritis, right knee: Secondary | ICD-10-CM | POA: Diagnosis not present

## 2018-10-28 DIAGNOSIS — M1711 Unilateral primary osteoarthritis, right knee: Secondary | ICD-10-CM | POA: Diagnosis not present

## 2018-10-28 DIAGNOSIS — M722 Plantar fascial fibromatosis: Secondary | ICD-10-CM | POA: Diagnosis not present

## 2018-11-13 DIAGNOSIS — Z853 Personal history of malignant neoplasm of breast: Secondary | ICD-10-CM | POA: Diagnosis not present

## 2018-11-13 DIAGNOSIS — N952 Postmenopausal atrophic vaginitis: Secondary | ICD-10-CM | POA: Diagnosis not present

## 2018-11-13 DIAGNOSIS — N8111 Cystocele, midline: Secondary | ICD-10-CM | POA: Diagnosis not present

## 2018-11-13 DIAGNOSIS — N898 Other specified noninflammatory disorders of vagina: Secondary | ICD-10-CM | POA: Diagnosis not present

## 2018-11-13 DIAGNOSIS — Z01419 Encounter for gynecological examination (general) (routine) without abnormal findings: Secondary | ICD-10-CM | POA: Diagnosis not present

## 2018-11-25 DIAGNOSIS — M1711 Unilateral primary osteoarthritis, right knee: Secondary | ICD-10-CM | POA: Diagnosis not present

## 2019-01-06 DIAGNOSIS — M722 Plantar fascial fibromatosis: Secondary | ICD-10-CM | POA: Diagnosis not present

## 2019-01-27 DIAGNOSIS — H35361 Drusen (degenerative) of macula, right eye: Secondary | ICD-10-CM | POA: Diagnosis not present

## 2019-01-27 DIAGNOSIS — H34832 Tributary (branch) retinal vein occlusion, left eye, with macular edema: Secondary | ICD-10-CM | POA: Diagnosis not present

## 2019-01-27 DIAGNOSIS — E113293 Type 2 diabetes mellitus with mild nonproliferative diabetic retinopathy without macular edema, bilateral: Secondary | ICD-10-CM | POA: Diagnosis not present

## 2019-01-27 DIAGNOSIS — H2513 Age-related nuclear cataract, bilateral: Secondary | ICD-10-CM | POA: Diagnosis not present

## 2019-02-08 DIAGNOSIS — M1711 Unilateral primary osteoarthritis, right knee: Secondary | ICD-10-CM | POA: Diagnosis not present

## 2019-02-09 DIAGNOSIS — E113291 Type 2 diabetes mellitus with mild nonproliferative diabetic retinopathy without macular edema, right eye: Secondary | ICD-10-CM | POA: Diagnosis not present

## 2019-02-09 DIAGNOSIS — I10 Essential (primary) hypertension: Secondary | ICD-10-CM | POA: Diagnosis not present

## 2019-02-09 DIAGNOSIS — E039 Hypothyroidism, unspecified: Secondary | ICD-10-CM | POA: Diagnosis not present

## 2019-02-09 DIAGNOSIS — M792 Neuralgia and neuritis, unspecified: Secondary | ICD-10-CM | POA: Diagnosis not present

## 2019-02-09 DIAGNOSIS — J309 Allergic rhinitis, unspecified: Secondary | ICD-10-CM | POA: Diagnosis not present

## 2019-02-11 DIAGNOSIS — Z23 Encounter for immunization: Secondary | ICD-10-CM | POA: Diagnosis not present

## 2019-02-11 DIAGNOSIS — E113291 Type 2 diabetes mellitus with mild nonproliferative diabetic retinopathy without macular edema, right eye: Secondary | ICD-10-CM | POA: Diagnosis not present

## 2019-04-20 IMAGING — MR MR HEAD W/O CM
8 of 10 series · 36 of 48 positions shown · non-contrast
Comparison: CT head earlier today. Also report from CT cervical
spine 01/18/2003.

CLINICAL DATA: Chief complaint of headache. Breast cancer.
Diabetes. Hypertension. No fever. Generalized weakness.

EXAM:
MRI HEAD WITHOUT CONTRAST
TECHNIQUE: Multiplanar, multiecho pulse sequences of the brain and surrounding
structures were obtained without intravenous contrast.

[Series 4: DWI · axial · 3.0mm · 1.09mm/px · z∈[-66,+69]mm · 9 of 98 slices shown (1 of 4)]
[im 1/98]
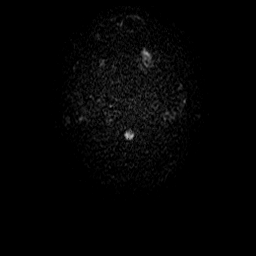
[im 13/98]
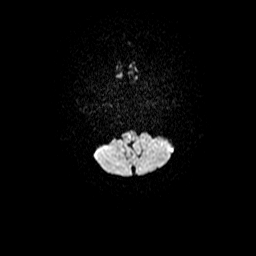
[im 25/98]
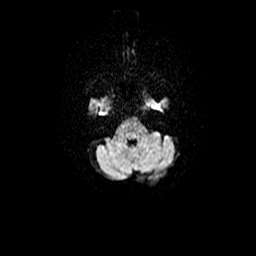
[im 37/98]
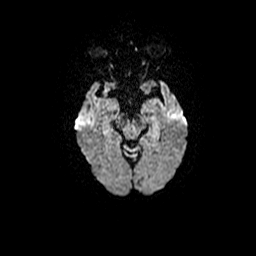
[im 49/98]
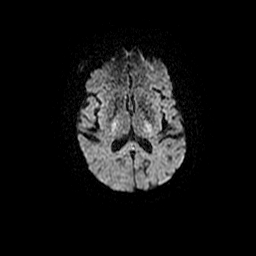
[im 61/98]
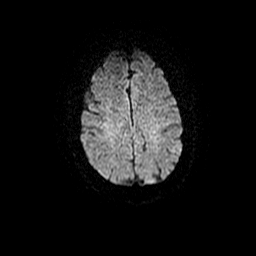
[im 73/98]
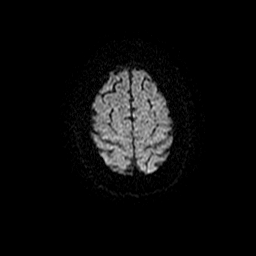
[im 85/98]
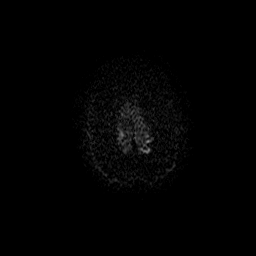
[im 98/98]
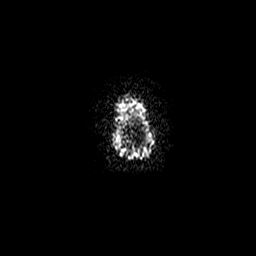

[Series 5: DWI · coronal · 5.0mm · 1.09mm/px · 7 of 72 slices shown (2 of 4)]
[im 1/72]
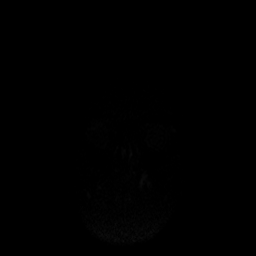
[im 12/72]
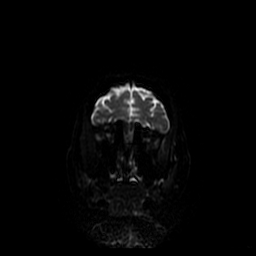
[im 24/72]
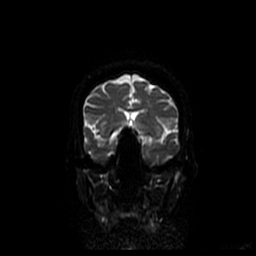
[im 36/72]
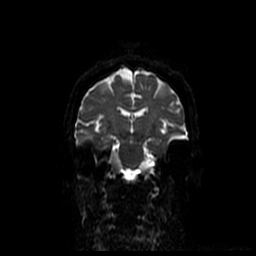
[im 48/72]
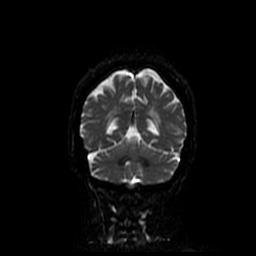
[im 60/72]
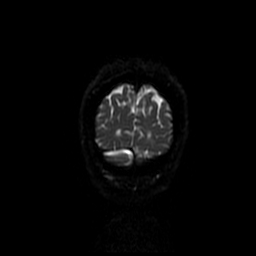
[im 72/72]
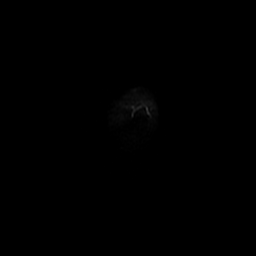

[Series 6: T1 · sagittal · 5.0mm · 0.47mm/px · 3 of 26 slices shown]
[im 1/26]
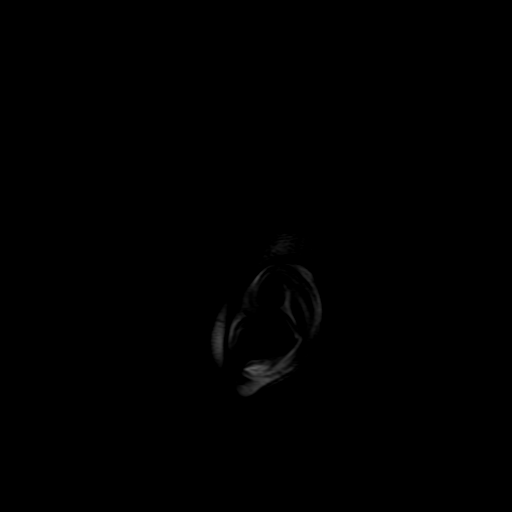
[im 13/26]
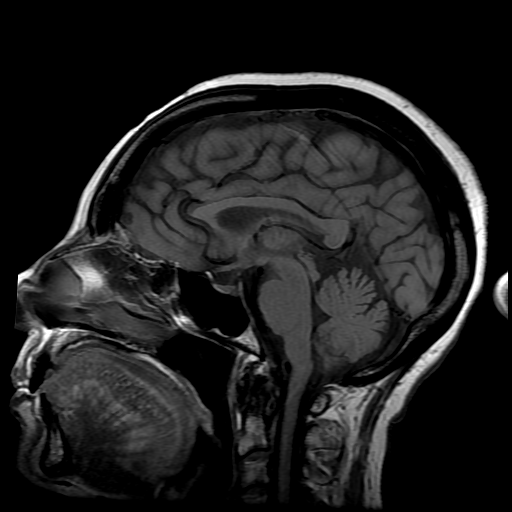
[im 26/26]
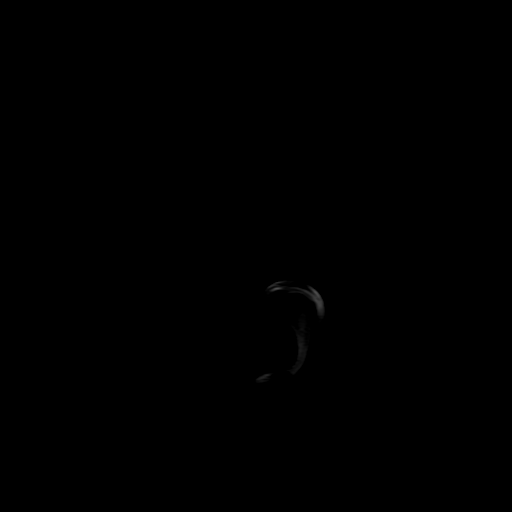

[Series 7: T2 · axial · 5.0mm · 0.43mm/px · z∈[-54,+82]mm · 2 of 25 slices shown (1 of 2)]
[im 1/25]
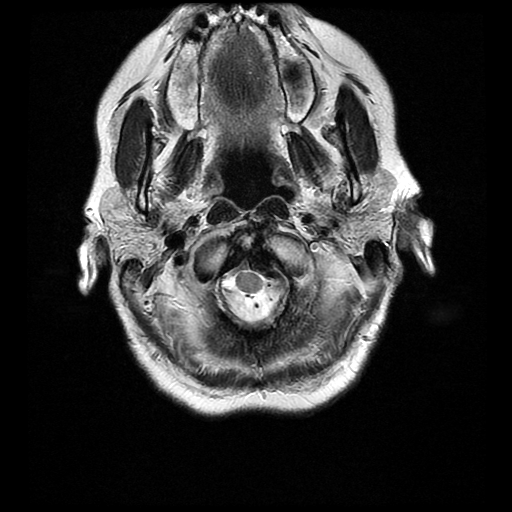
[im 25/25]
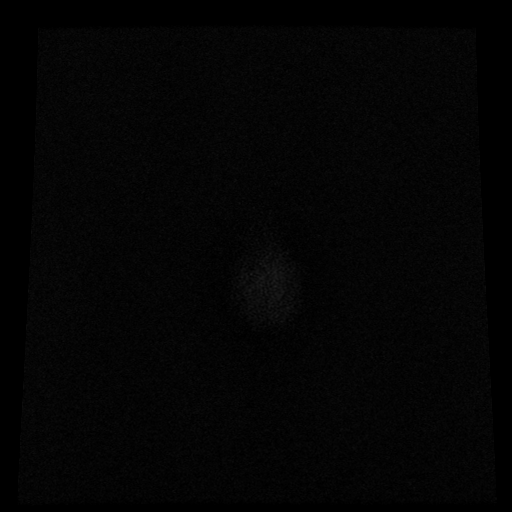

[Series 8: FLAIR · axial · 3.0mm · 0.43mm/px · z∈[-57,+83]mm · 3 of 26 slices shown]
[im 1/26]
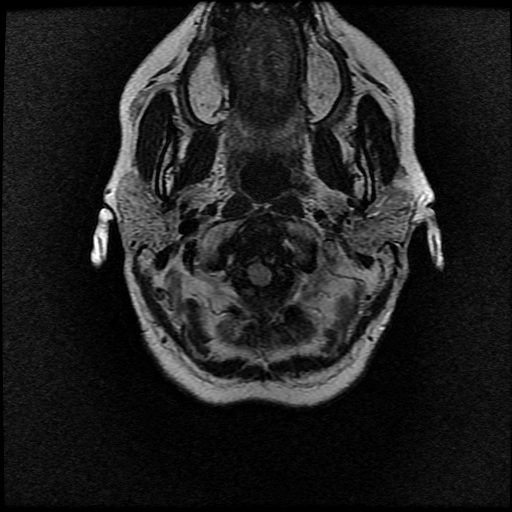
[im 13/26]
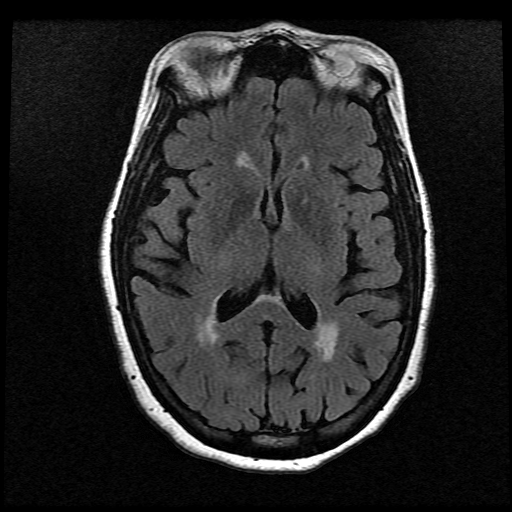
[im 26/26]
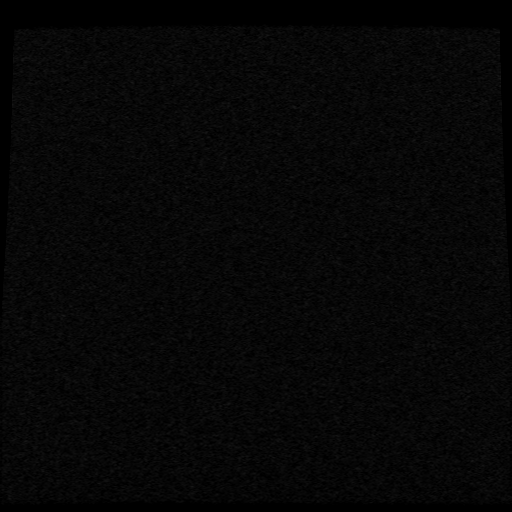

[Series 11: T2 · coronal · 5.0mm · 0.39mm/px · 3 of 28 slices shown (2 of 2)]
[im 1/28]
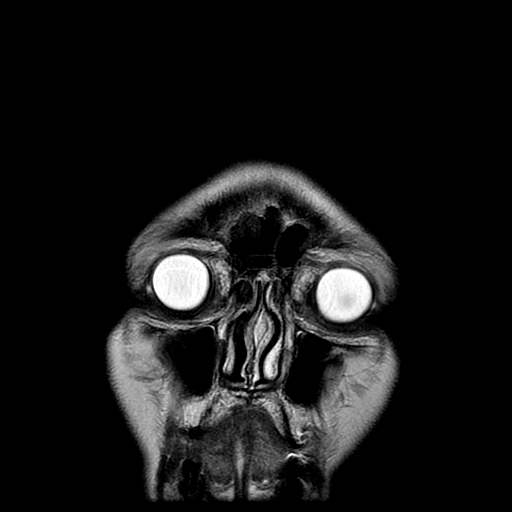
[im 14/28]
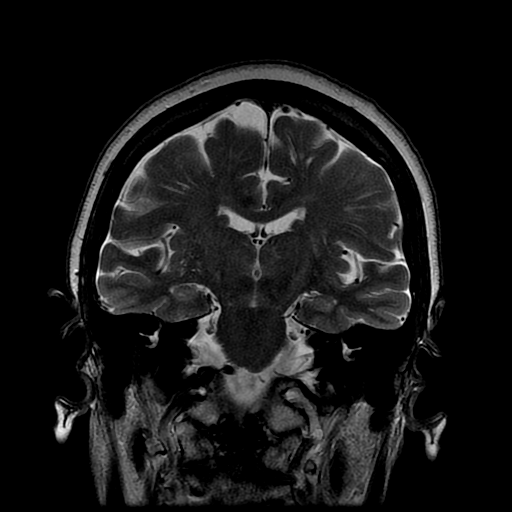
[im 28/28]
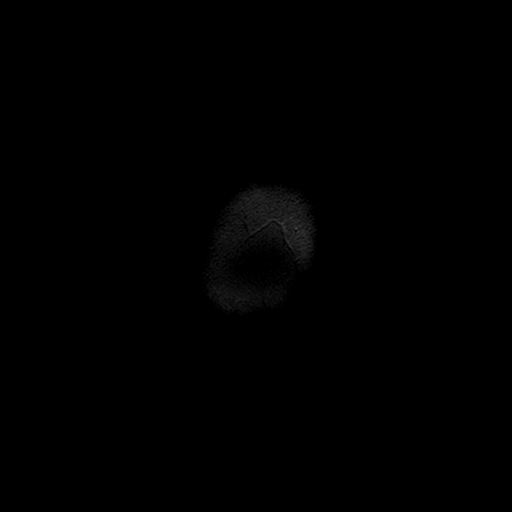

[Series 400: DWI · axial · 3.0mm · 1.09mm/px · z∈[-66,+69]mm · 5 of 49 slices shown (3 of 4)]
[im 1/49]
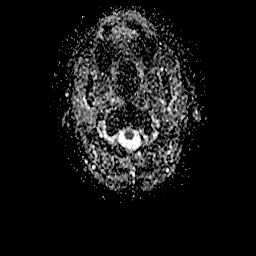
[im 13/49]
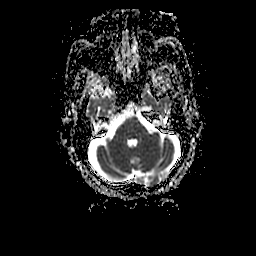
[im 25/49]
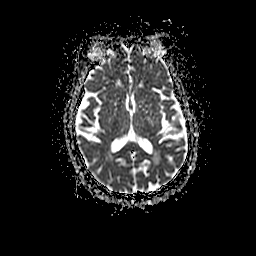
[im 37/49]
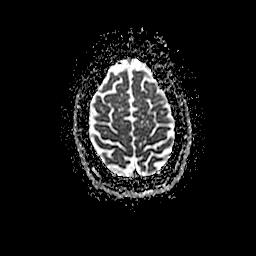
[im 49/49]
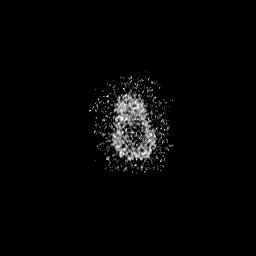

[Series 500: DWI · coronal · 5.0mm · 1.09mm/px · 4 of 36 slices shown (4 of 4)]
[im 1/36]
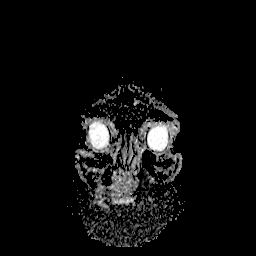
[im 12/36]
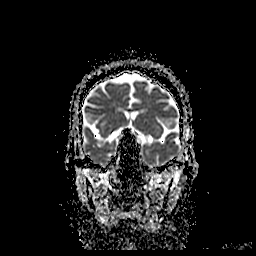
[im 24/36]
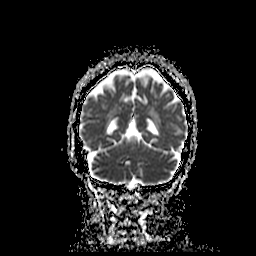
[im 36/36]
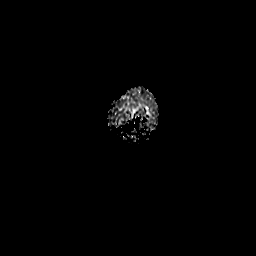

[36 of 48 positions shown; findings below may reference images not displayed]

FINDINGS: Brain: No evidence for acute infarction, hemorrhage, mass lesion,
hydrocephalus, or extra-axial fluid. Generalized atrophy. T2 and
FLAIR hyperintensities throughout the periventricular and
subcortical white matter, consistent with small vessel disease or
post treatment effect, from chemoradiation. Scattered areas of
lacunar infarction affect the deep white matter. Prominent
perivascular spaces are noted, secondary to hypertension.

Vascular: Flow voids are maintained. LEFT vertebral is dominant.
RIGHT vertebral appears to terminate in PICA.

Skull and upper cervical spine: Normal skull base. Calcific pannus
prominently noted in the ventral cervicomedullary subarachnoid
space, there is no cord compression. Prominent calcification of the
transverse ligament and structures around C1 was described in 7660,
and is likely to be relatively stable over the 15 year interval.

Sinuses/Orbits: No layering sinus fluid or significant opacity.
Negative orbits.

Other: No significant middle ear or mastoid fluid.
IMPRESSION: Atrophy and small vessel disease.  No acute intracranial findings.

Good general agreement with prior CT. No intracranial cause for
headache is seen.

Prominent calcific pannus, without cervicomedullary compression, was
noted on prior report from [DATE].

## 2019-05-26 DIAGNOSIS — H43811 Vitreous degeneration, right eye: Secondary | ICD-10-CM | POA: Diagnosis not present

## 2019-05-26 DIAGNOSIS — E113293 Type 2 diabetes mellitus with mild nonproliferative diabetic retinopathy without macular edema, bilateral: Secondary | ICD-10-CM | POA: Diagnosis not present

## 2019-05-26 DIAGNOSIS — H35361 Drusen (degenerative) of macula, right eye: Secondary | ICD-10-CM | POA: Diagnosis not present

## 2019-05-26 DIAGNOSIS — H34832 Tributary (branch) retinal vein occlusion, left eye, with macular edema: Secondary | ICD-10-CM | POA: Diagnosis not present

## 2019-06-09 DIAGNOSIS — H34832 Tributary (branch) retinal vein occlusion, left eye, with macular edema: Secondary | ICD-10-CM | POA: Diagnosis not present

## 2019-07-14 ENCOUNTER — Other Ambulatory Visit: Payer: Self-pay | Admitting: Family Medicine

## 2019-07-14 DIAGNOSIS — Z1231 Encounter for screening mammogram for malignant neoplasm of breast: Secondary | ICD-10-CM

## 2019-08-11 DIAGNOSIS — R251 Tremor, unspecified: Secondary | ICD-10-CM | POA: Diagnosis not present

## 2019-08-11 DIAGNOSIS — E113293 Type 2 diabetes mellitus with mild nonproliferative diabetic retinopathy without macular edema, bilateral: Secondary | ICD-10-CM | POA: Diagnosis not present

## 2019-08-11 DIAGNOSIS — I10 Essential (primary) hypertension: Secondary | ICD-10-CM | POA: Diagnosis not present

## 2019-08-11 DIAGNOSIS — E039 Hypothyroidism, unspecified: Secondary | ICD-10-CM | POA: Diagnosis not present

## 2019-08-11 DIAGNOSIS — M792 Neuralgia and neuritis, unspecified: Secondary | ICD-10-CM | POA: Diagnosis not present

## 2019-08-23 ENCOUNTER — Other Ambulatory Visit: Payer: Self-pay

## 2019-08-23 ENCOUNTER — Ambulatory Visit
Admission: RE | Admit: 2019-08-23 | Discharge: 2019-08-23 | Disposition: A | Discharge status: Home / Self Care | Payer: Medicare HMO | Source: Ambulatory Visit | Attending: Family Medicine | Admitting: Family Medicine

## 2019-08-23 DIAGNOSIS — C50911 Malignant neoplasm of unspecified site of right female breast: Secondary | ICD-10-CM | POA: Diagnosis not present

## 2019-08-23 DIAGNOSIS — Z1231 Encounter for screening mammogram for malignant neoplasm of breast: Secondary | ICD-10-CM

## 2019-08-25 DIAGNOSIS — Z0100 Encounter for examination of eyes and vision without abnormal findings: Secondary | ICD-10-CM | POA: Diagnosis not present

## 2019-08-30 DIAGNOSIS — C50911 Malignant neoplasm of unspecified site of right female breast: Secondary | ICD-10-CM | POA: Diagnosis not present

## 2019-09-03 DIAGNOSIS — E113212 Type 2 diabetes mellitus with mild nonproliferative diabetic retinopathy with macular edema, left eye: Secondary | ICD-10-CM | POA: Diagnosis not present

## 2019-09-03 DIAGNOSIS — H34832 Tributary (branch) retinal vein occlusion, left eye, with macular edema: Secondary | ICD-10-CM | POA: Diagnosis not present

## 2019-09-03 DIAGNOSIS — E113391 Type 2 diabetes mellitus with moderate nonproliferative diabetic retinopathy without macular edema, right eye: Secondary | ICD-10-CM | POA: Diagnosis not present

## 2019-09-03 DIAGNOSIS — H35361 Drusen (degenerative) of macula, right eye: Secondary | ICD-10-CM | POA: Diagnosis not present

## 2019-10-08 DIAGNOSIS — E1351 Other specified diabetes mellitus with diabetic peripheral angiopathy without gangrene: Secondary | ICD-10-CM | POA: Diagnosis not present

## 2019-10-08 DIAGNOSIS — M2141 Flat foot [pes planus] (acquired), right foot: Secondary | ICD-10-CM | POA: Diagnosis not present

## 2019-10-08 DIAGNOSIS — M2142 Flat foot [pes planus] (acquired), left foot: Secondary | ICD-10-CM | POA: Diagnosis not present

## 2019-10-08 DIAGNOSIS — M76822 Posterior tibial tendinitis, left leg: Secondary | ICD-10-CM | POA: Diagnosis not present

## 2019-10-08 DIAGNOSIS — M76821 Posterior tibial tendinitis, right leg: Secondary | ICD-10-CM | POA: Diagnosis not present

## 2019-10-08 DIAGNOSIS — M792 Neuralgia and neuritis, unspecified: Secondary | ICD-10-CM | POA: Diagnosis not present

## 2019-11-01 DIAGNOSIS — M2142 Flat foot [pes planus] (acquired), left foot: Secondary | ICD-10-CM | POA: Diagnosis not present

## 2019-11-01 DIAGNOSIS — M722 Plantar fascial fibromatosis: Secondary | ICD-10-CM | POA: Diagnosis not present

## 2019-11-01 DIAGNOSIS — E1351 Other specified diabetes mellitus with diabetic peripheral angiopathy without gangrene: Secondary | ICD-10-CM | POA: Diagnosis not present

## 2019-11-01 DIAGNOSIS — M2141 Flat foot [pes planus] (acquired), right foot: Secondary | ICD-10-CM | POA: Diagnosis not present

## 2019-11-16 ENCOUNTER — Other Ambulatory Visit: Payer: Self-pay | Admitting: Obstetrics and Gynecology

## 2019-11-16 DIAGNOSIS — Z13820 Encounter for screening for osteoporosis: Secondary | ICD-10-CM

## 2019-11-16 DIAGNOSIS — Z01419 Encounter for gynecological examination (general) (routine) without abnormal findings: Secondary | ICD-10-CM | POA: Diagnosis not present

## 2019-11-29 DIAGNOSIS — E1351 Other specified diabetes mellitus with diabetic peripheral angiopathy without gangrene: Secondary | ICD-10-CM | POA: Diagnosis not present

## 2019-11-29 DIAGNOSIS — M792 Neuralgia and neuritis, unspecified: Secondary | ICD-10-CM | POA: Diagnosis not present

## 2019-11-29 DIAGNOSIS — M2142 Flat foot [pes planus] (acquired), left foot: Secondary | ICD-10-CM | POA: Diagnosis not present

## 2019-11-29 DIAGNOSIS — M2141 Flat foot [pes planus] (acquired), right foot: Secondary | ICD-10-CM | POA: Diagnosis not present

## 2019-12-03 DIAGNOSIS — E1351 Other specified diabetes mellitus with diabetic peripheral angiopathy without gangrene: Secondary | ICD-10-CM | POA: Diagnosis not present

## 2019-12-03 DIAGNOSIS — M792 Neuralgia and neuritis, unspecified: Secondary | ICD-10-CM | POA: Diagnosis not present

## 2019-12-03 DIAGNOSIS — M2141 Flat foot [pes planus] (acquired), right foot: Secondary | ICD-10-CM | POA: Diagnosis not present

## 2019-12-03 DIAGNOSIS — M2142 Flat foot [pes planus] (acquired), left foot: Secondary | ICD-10-CM | POA: Diagnosis not present

## 2019-12-16 DIAGNOSIS — I739 Peripheral vascular disease, unspecified: Secondary | ICD-10-CM | POA: Diagnosis not present

## 2019-12-31 DIAGNOSIS — M722 Plantar fascial fibromatosis: Secondary | ICD-10-CM | POA: Diagnosis not present

## 2019-12-31 DIAGNOSIS — M2141 Flat foot [pes planus] (acquired), right foot: Secondary | ICD-10-CM | POA: Diagnosis not present

## 2019-12-31 DIAGNOSIS — M2142 Flat foot [pes planus] (acquired), left foot: Secondary | ICD-10-CM | POA: Diagnosis not present

## 2019-12-31 DIAGNOSIS — M76822 Posterior tibial tendinitis, left leg: Secondary | ICD-10-CM | POA: Diagnosis not present

## 2019-12-31 DIAGNOSIS — M76821 Posterior tibial tendinitis, right leg: Secondary | ICD-10-CM | POA: Diagnosis not present

## 2019-12-31 DIAGNOSIS — M792 Neuralgia and neuritis, unspecified: Secondary | ICD-10-CM | POA: Diagnosis not present

## 2019-12-31 DIAGNOSIS — E1351 Other specified diabetes mellitus with diabetic peripheral angiopathy without gangrene: Secondary | ICD-10-CM | POA: Diagnosis not present

## 2020-01-03 DIAGNOSIS — M2141 Flat foot [pes planus] (acquired), right foot: Secondary | ICD-10-CM | POA: Diagnosis not present

## 2020-01-03 DIAGNOSIS — M2142 Flat foot [pes planus] (acquired), left foot: Secondary | ICD-10-CM | POA: Diagnosis not present

## 2020-01-03 DIAGNOSIS — M792 Neuralgia and neuritis, unspecified: Secondary | ICD-10-CM | POA: Diagnosis not present

## 2020-01-03 DIAGNOSIS — E1351 Other specified diabetes mellitus with diabetic peripheral angiopathy without gangrene: Secondary | ICD-10-CM | POA: Diagnosis not present

## 2020-01-05 DIAGNOSIS — H34832 Tributary (branch) retinal vein occlusion, left eye, with macular edema: Secondary | ICD-10-CM | POA: Diagnosis not present

## 2020-01-05 DIAGNOSIS — H43811 Vitreous degeneration, right eye: Secondary | ICD-10-CM | POA: Diagnosis not present

## 2020-01-05 DIAGNOSIS — E113392 Type 2 diabetes mellitus with moderate nonproliferative diabetic retinopathy without macular edema, left eye: Secondary | ICD-10-CM | POA: Diagnosis not present

## 2020-01-05 DIAGNOSIS — E113291 Type 2 diabetes mellitus with mild nonproliferative diabetic retinopathy without macular edema, right eye: Secondary | ICD-10-CM | POA: Diagnosis not present

## 2020-01-12 DIAGNOSIS — H34832 Tributary (branch) retinal vein occlusion, left eye, with macular edema: Secondary | ICD-10-CM | POA: Diagnosis not present

## 2020-01-28 DIAGNOSIS — H903 Sensorineural hearing loss, bilateral: Secondary | ICD-10-CM | POA: Diagnosis not present

## 2020-02-08 DIAGNOSIS — H903 Sensorineural hearing loss, bilateral: Secondary | ICD-10-CM | POA: Diagnosis not present

## 2020-02-15 DIAGNOSIS — M2141 Flat foot [pes planus] (acquired), right foot: Secondary | ICD-10-CM | POA: Diagnosis not present

## 2020-02-15 DIAGNOSIS — M792 Neuralgia and neuritis, unspecified: Secondary | ICD-10-CM | POA: Diagnosis not present

## 2020-02-15 DIAGNOSIS — M65871 Other synovitis and tenosynovitis, right ankle and foot: Secondary | ICD-10-CM | POA: Diagnosis not present

## 2020-02-15 DIAGNOSIS — E1351 Other specified diabetes mellitus with diabetic peripheral angiopathy without gangrene: Secondary | ICD-10-CM | POA: Diagnosis not present

## 2020-02-15 DIAGNOSIS — M2142 Flat foot [pes planus] (acquired), left foot: Secondary | ICD-10-CM | POA: Diagnosis not present

## 2020-02-25 ENCOUNTER — Other Ambulatory Visit: Payer: Self-pay

## 2020-02-25 ENCOUNTER — Ambulatory Visit
Admission: RE | Admit: 2020-02-25 | Discharge: 2020-02-25 | Disposition: A | Discharge status: Home / Self Care | Payer: Medicare HMO | Source: Ambulatory Visit | Attending: Obstetrics and Gynecology | Admitting: Obstetrics and Gynecology

## 2020-02-25 DIAGNOSIS — Z780 Asymptomatic menopausal state: Secondary | ICD-10-CM | POA: Diagnosis not present

## 2020-02-25 DIAGNOSIS — Z13820 Encounter for screening for osteoporosis: Secondary | ICD-10-CM

## 2020-03-07 DIAGNOSIS — I129 Hypertensive chronic kidney disease with stage 1 through stage 4 chronic kidney disease, or unspecified chronic kidney disease: Secondary | ICD-10-CM | POA: Diagnosis not present

## 2020-03-07 DIAGNOSIS — M899 Disorder of bone, unspecified: Secondary | ICD-10-CM | POA: Diagnosis not present

## 2020-03-07 DIAGNOSIS — E871 Hypo-osmolality and hyponatremia: Secondary | ICD-10-CM | POA: Diagnosis not present

## 2020-03-07 DIAGNOSIS — N183 Chronic kidney disease, stage 3 unspecified: Secondary | ICD-10-CM | POA: Diagnosis not present

## 2020-03-07 DIAGNOSIS — I10 Essential (primary) hypertension: Secondary | ICD-10-CM | POA: Diagnosis not present

## 2020-03-08 DIAGNOSIS — E113391 Type 2 diabetes mellitus with moderate nonproliferative diabetic retinopathy without macular edema, right eye: Secondary | ICD-10-CM | POA: Diagnosis not present

## 2020-03-08 DIAGNOSIS — E113292 Type 2 diabetes mellitus with mild nonproliferative diabetic retinopathy without macular edema, left eye: Secondary | ICD-10-CM | POA: Diagnosis not present

## 2020-03-08 DIAGNOSIS — H34832 Tributary (branch) retinal vein occlusion, left eye, with macular edema: Secondary | ICD-10-CM | POA: Diagnosis not present

## 2020-03-13 DIAGNOSIS — E039 Hypothyroidism, unspecified: Secondary | ICD-10-CM | POA: Diagnosis not present

## 2020-03-13 DIAGNOSIS — I10 Essential (primary) hypertension: Secondary | ICD-10-CM | POA: Diagnosis not present

## 2020-03-13 DIAGNOSIS — E113293 Type 2 diabetes mellitus with mild nonproliferative diabetic retinopathy without macular edema, bilateral: Secondary | ICD-10-CM | POA: Diagnosis not present

## 2020-03-13 DIAGNOSIS — M792 Neuralgia and neuritis, unspecified: Secondary | ICD-10-CM | POA: Diagnosis not present

## 2020-03-13 DIAGNOSIS — Z23 Encounter for immunization: Secondary | ICD-10-CM | POA: Diagnosis not present

## 2020-03-13 DIAGNOSIS — Z0000 Encounter for general adult medical examination without abnormal findings: Secondary | ICD-10-CM | POA: Diagnosis not present

## 2020-03-16 ENCOUNTER — Other Ambulatory Visit: Payer: Self-pay | Admitting: Nephrology

## 2020-03-16 DIAGNOSIS — E1351 Other specified diabetes mellitus with diabetic peripheral angiopathy without gangrene: Secondary | ICD-10-CM | POA: Diagnosis not present

## 2020-03-16 DIAGNOSIS — M2142 Flat foot [pes planus] (acquired), left foot: Secondary | ICD-10-CM | POA: Diagnosis not present

## 2020-03-16 DIAGNOSIS — I10 Essential (primary) hypertension: Secondary | ICD-10-CM

## 2020-03-16 DIAGNOSIS — N183 Chronic kidney disease, stage 3 unspecified: Secondary | ICD-10-CM

## 2020-03-16 DIAGNOSIS — M2141 Flat foot [pes planus] (acquired), right foot: Secondary | ICD-10-CM | POA: Diagnosis not present

## 2020-03-16 DIAGNOSIS — M76822 Posterior tibial tendinitis, left leg: Secondary | ICD-10-CM | POA: Diagnosis not present

## 2020-03-21 DIAGNOSIS — N183 Chronic kidney disease, stage 3 unspecified: Secondary | ICD-10-CM | POA: Diagnosis not present

## 2020-03-25 ENCOUNTER — Ambulatory Visit: Payer: Medicare HMO | Attending: Internal Medicine

## 2020-03-25 DIAGNOSIS — Z23 Encounter for immunization: Secondary | ICD-10-CM

## 2020-03-25 NOTE — Progress Notes (Signed)
   Covid-19 Vaccination Clinic  Name:  [name]    MRN: [id number] DOB: 01-Feb-1946  03/25/2020  [name] was observed post Covid-19 immunization for 15 minutes without incident. She was provided with Vaccine Information Sheet and instruction to access the V-Safe system.   [name] was instructed to call 911 with any severe reactions post vaccine: Marland Kitchen Difficulty breathing  . Swelling of face and throat  . A fast heartbeat  . A bad rash all over body  . Dizziness and weakness

## 2020-03-27 ENCOUNTER — Ambulatory Visit
Admission: RE | Admit: 2020-03-27 | Discharge: 2020-03-27 | Disposition: A | Discharge status: Home / Self Care | Payer: Medicare HMO | Source: Ambulatory Visit | Attending: Nephrology | Admitting: Nephrology

## 2020-03-27 DIAGNOSIS — I10 Essential (primary) hypertension: Secondary | ICD-10-CM

## 2020-03-27 DIAGNOSIS — N183 Chronic kidney disease, stage 3 unspecified: Secondary | ICD-10-CM

## 2020-03-27 DIAGNOSIS — I129 Hypertensive chronic kidney disease with stage 1 through stage 4 chronic kidney disease, or unspecified chronic kidney disease: Secondary | ICD-10-CM | POA: Diagnosis not present

## 2020-04-03 DIAGNOSIS — N189 Chronic kidney disease, unspecified: Secondary | ICD-10-CM | POA: Diagnosis not present

## 2020-04-03 DIAGNOSIS — I10 Essential (primary) hypertension: Secondary | ICD-10-CM | POA: Diagnosis not present

## 2020-04-17 DIAGNOSIS — M2142 Flat foot [pes planus] (acquired), left foot: Secondary | ICD-10-CM | POA: Diagnosis not present

## 2020-04-17 DIAGNOSIS — M2141 Flat foot [pes planus] (acquired), right foot: Secondary | ICD-10-CM | POA: Diagnosis not present

## 2020-04-17 DIAGNOSIS — E1351 Other specified diabetes mellitus with diabetic peripheral angiopathy without gangrene: Secondary | ICD-10-CM | POA: Diagnosis not present

## 2020-04-17 DIAGNOSIS — M792 Neuralgia and neuritis, unspecified: Secondary | ICD-10-CM | POA: Diagnosis not present

## 2020-04-18 DIAGNOSIS — E871 Hypo-osmolality and hyponatremia: Secondary | ICD-10-CM | POA: Diagnosis not present

## 2020-05-10 DIAGNOSIS — E113393 Type 2 diabetes mellitus with moderate nonproliferative diabetic retinopathy without macular edema, bilateral: Secondary | ICD-10-CM | POA: Diagnosis not present

## 2020-05-10 DIAGNOSIS — H34832 Tributary (branch) retinal vein occlusion, left eye, with macular edema: Secondary | ICD-10-CM | POA: Diagnosis not present

## 2020-05-10 DIAGNOSIS — E113392 Type 2 diabetes mellitus with moderate nonproliferative diabetic retinopathy without macular edema, left eye: Secondary | ICD-10-CM | POA: Diagnosis not present

## 2020-05-10 DIAGNOSIS — E113391 Type 2 diabetes mellitus with moderate nonproliferative diabetic retinopathy without macular edema, right eye: Secondary | ICD-10-CM | POA: Diagnosis not present

## 2020-05-16 DIAGNOSIS — N189 Chronic kidney disease, unspecified: Secondary | ICD-10-CM | POA: Diagnosis not present

## 2020-05-16 DIAGNOSIS — I10 Essential (primary) hypertension: Secondary | ICD-10-CM | POA: Diagnosis not present

## 2020-05-16 DIAGNOSIS — K219 Gastro-esophageal reflux disease without esophagitis: Secondary | ICD-10-CM | POA: Diagnosis not present

## 2020-05-31 DIAGNOSIS — N189 Chronic kidney disease, unspecified: Secondary | ICD-10-CM | POA: Diagnosis not present

## 2020-05-31 DIAGNOSIS — I10 Essential (primary) hypertension: Secondary | ICD-10-CM | POA: Diagnosis not present

## 2020-07-03 DIAGNOSIS — I10 Essential (primary) hypertension: Secondary | ICD-10-CM | POA: Diagnosis not present

## 2020-07-03 DIAGNOSIS — N189 Chronic kidney disease, unspecified: Secondary | ICD-10-CM | POA: Diagnosis not present

## 2020-07-12 ENCOUNTER — Other Ambulatory Visit: Payer: Self-pay | Admitting: Family Medicine

## 2020-07-12 DIAGNOSIS — H34832 Tributary (branch) retinal vein occlusion, left eye, with macular edema: Secondary | ICD-10-CM | POA: Diagnosis not present

## 2020-07-12 DIAGNOSIS — Z1231 Encounter for screening mammogram for malignant neoplasm of breast: Secondary | ICD-10-CM

## 2020-07-12 DIAGNOSIS — E113292 Type 2 diabetes mellitus with mild nonproliferative diabetic retinopathy without macular edema, left eye: Secondary | ICD-10-CM | POA: Diagnosis not present

## 2020-07-12 DIAGNOSIS — E113391 Type 2 diabetes mellitus with moderate nonproliferative diabetic retinopathy without macular edema, right eye: Secondary | ICD-10-CM | POA: Diagnosis not present

## 2020-07-12 DIAGNOSIS — H43811 Vitreous degeneration, right eye: Secondary | ICD-10-CM | POA: Diagnosis not present

## 2020-07-20 DIAGNOSIS — I10 Essential (primary) hypertension: Secondary | ICD-10-CM | POA: Diagnosis not present

## 2020-07-20 DIAGNOSIS — N189 Chronic kidney disease, unspecified: Secondary | ICD-10-CM | POA: Diagnosis not present

## 2020-08-02 DIAGNOSIS — R609 Edema, unspecified: Secondary | ICD-10-CM | POA: Diagnosis not present

## 2020-08-02 DIAGNOSIS — I10 Essential (primary) hypertension: Secondary | ICD-10-CM | POA: Diagnosis not present

## 2020-08-29 ENCOUNTER — Inpatient Hospital Stay: Admission: RE | Admit: 2020-08-29 | Payer: Medicare HMO | Source: Ambulatory Visit

## 2020-09-02 ENCOUNTER — Ambulatory Visit
Admission: RE | Admit: 2020-09-02 | Discharge: 2020-09-02 | Disposition: A | Discharge status: Home / Self Care | Payer: Medicare HMO | Source: Ambulatory Visit | Attending: Family Medicine | Admitting: Family Medicine

## 2020-09-02 DIAGNOSIS — Z1231 Encounter for screening mammogram for malignant neoplasm of breast: Secondary | ICD-10-CM | POA: Diagnosis not present

## 2020-09-06 ENCOUNTER — Other Ambulatory Visit: Payer: Self-pay | Admitting: Family Medicine

## 2020-09-06 DIAGNOSIS — R928 Other abnormal and inconclusive findings on diagnostic imaging of breast: Secondary | ICD-10-CM

## 2020-09-11 DIAGNOSIS — E039 Hypothyroidism, unspecified: Secondary | ICD-10-CM | POA: Diagnosis not present

## 2020-09-11 DIAGNOSIS — E113293 Type 2 diabetes mellitus with mild nonproliferative diabetic retinopathy without macular edema, bilateral: Secondary | ICD-10-CM | POA: Diagnosis not present

## 2020-09-11 DIAGNOSIS — M792 Neuralgia and neuritis, unspecified: Secondary | ICD-10-CM | POA: Diagnosis not present

## 2020-09-11 DIAGNOSIS — R251 Tremor, unspecified: Secondary | ICD-10-CM | POA: Diagnosis not present

## 2020-09-11 DIAGNOSIS — I10 Essential (primary) hypertension: Secondary | ICD-10-CM | POA: Diagnosis not present

## 2020-09-13 DIAGNOSIS — H52223 Regular astigmatism, bilateral: Secondary | ICD-10-CM | POA: Diagnosis not present

## 2020-09-15 DIAGNOSIS — C50911 Malignant neoplasm of unspecified site of right female breast: Secondary | ICD-10-CM | POA: Diagnosis not present

## 2020-09-19 DIAGNOSIS — C50911 Malignant neoplasm of unspecified site of right female breast: Secondary | ICD-10-CM | POA: Diagnosis not present

## 2020-09-20 DIAGNOSIS — E113292 Type 2 diabetes mellitus with mild nonproliferative diabetic retinopathy without macular edema, left eye: Secondary | ICD-10-CM | POA: Diagnosis not present

## 2020-09-20 DIAGNOSIS — H43811 Vitreous degeneration, right eye: Secondary | ICD-10-CM | POA: Diagnosis not present

## 2020-09-20 DIAGNOSIS — E113391 Type 2 diabetes mellitus with moderate nonproliferative diabetic retinopathy without macular edema, right eye: Secondary | ICD-10-CM | POA: Diagnosis not present

## 2020-09-20 DIAGNOSIS — H34832 Tributary (branch) retinal vein occlusion, left eye, with macular edema: Secondary | ICD-10-CM | POA: Diagnosis not present

## 2020-09-25 ENCOUNTER — Other Ambulatory Visit: Payer: Self-pay

## 2020-09-25 ENCOUNTER — Ambulatory Visit
Admission: RE | Admit: 2020-09-25 | Discharge: 2020-09-25 | Disposition: A | Discharge status: Home / Self Care | Payer: Medicare HMO | Source: Ambulatory Visit | Attending: Family Medicine | Admitting: Family Medicine

## 2020-09-25 DIAGNOSIS — N6489 Other specified disorders of breast: Secondary | ICD-10-CM | POA: Diagnosis not present

## 2020-09-25 DIAGNOSIS — R922 Inconclusive mammogram: Secondary | ICD-10-CM | POA: Diagnosis not present

## 2020-09-25 DIAGNOSIS — R928 Other abnormal and inconclusive findings on diagnostic imaging of breast: Secondary | ICD-10-CM

## 2020-10-09 ENCOUNTER — Other Ambulatory Visit: Payer: Self-pay | Admitting: Family Medicine

## 2020-10-09 ENCOUNTER — Other Ambulatory Visit: Payer: Self-pay

## 2020-10-09 DIAGNOSIS — Z853 Personal history of malignant neoplasm of breast: Secondary | ICD-10-CM

## 2020-10-09 DIAGNOSIS — R928 Other abnormal and inconclusive findings on diagnostic imaging of breast: Secondary | ICD-10-CM

## 2020-10-21 ENCOUNTER — Other Ambulatory Visit: Payer: Self-pay

## 2020-10-21 ENCOUNTER — Ambulatory Visit
Admission: RE | Admit: 2020-10-21 | Discharge: 2020-10-21 | Disposition: A | Discharge status: Home / Self Care | Payer: Medicare HMO | Source: Ambulatory Visit | Attending: Family Medicine | Admitting: Family Medicine

## 2020-10-21 DIAGNOSIS — R928 Other abnormal and inconclusive findings on diagnostic imaging of breast: Secondary | ICD-10-CM

## 2020-10-21 DIAGNOSIS — Z853 Personal history of malignant neoplasm of breast: Secondary | ICD-10-CM

## 2020-10-21 DIAGNOSIS — N6489 Other specified disorders of breast: Secondary | ICD-10-CM | POA: Diagnosis not present

## 2020-10-21 MED ORDER — GADOBUTROL 1 MMOL/ML IV SOLN
7.0000 mL | Freq: Once | INTRAVENOUS | Status: AC | PRN
  Administered 2020-10-21: 7 mL via INTRAVENOUS

## 2020-10-23 ENCOUNTER — Other Ambulatory Visit: Payer: Self-pay | Admitting: Family Medicine

## 2020-10-23 DIAGNOSIS — Z853 Personal history of malignant neoplasm of breast: Secondary | ICD-10-CM

## 2020-10-23 DIAGNOSIS — R928 Other abnormal and inconclusive findings on diagnostic imaging of breast: Secondary | ICD-10-CM

## 2020-10-24 ENCOUNTER — Other Ambulatory Visit: Payer: Self-pay

## 2020-10-24 ENCOUNTER — Ambulatory Visit
Admission: RE | Admit: 2020-10-24 | Discharge: 2020-10-24 | Disposition: A | Discharge status: Home / Self Care | Payer: Medicare HMO | Source: Ambulatory Visit | Attending: Family Medicine | Admitting: Family Medicine

## 2020-10-24 DIAGNOSIS — Z853 Personal history of malignant neoplasm of breast: Secondary | ICD-10-CM

## 2020-10-24 DIAGNOSIS — R928 Other abnormal and inconclusive findings on diagnostic imaging of breast: Secondary | ICD-10-CM

## 2020-10-25 DIAGNOSIS — I70203 Unspecified atherosclerosis of native arteries of extremities, bilateral legs: Secondary | ICD-10-CM | POA: Diagnosis not present

## 2020-10-25 DIAGNOSIS — M792 Neuralgia and neuritis, unspecified: Secondary | ICD-10-CM | POA: Diagnosis not present

## 2020-10-25 DIAGNOSIS — M76821 Posterior tibial tendinitis, right leg: Secondary | ICD-10-CM | POA: Diagnosis not present

## 2020-10-25 DIAGNOSIS — E1351 Other specified diabetes mellitus with diabetic peripheral angiopathy without gangrene: Secondary | ICD-10-CM | POA: Diagnosis not present

## 2020-10-25 DIAGNOSIS — M76822 Posterior tibial tendinitis, left leg: Secondary | ICD-10-CM | POA: Diagnosis not present

## 2020-10-26 DIAGNOSIS — I10 Essential (primary) hypertension: Secondary | ICD-10-CM | POA: Diagnosis not present

## 2020-10-26 DIAGNOSIS — R609 Edema, unspecified: Secondary | ICD-10-CM | POA: Diagnosis not present

## 2020-10-31 DIAGNOSIS — E1351 Other specified diabetes mellitus with diabetic peripheral angiopathy without gangrene: Secondary | ICD-10-CM | POA: Diagnosis not present

## 2020-12-05 DIAGNOSIS — E1351 Other specified diabetes mellitus with diabetic peripheral angiopathy without gangrene: Secondary | ICD-10-CM | POA: Diagnosis not present

## 2020-12-13 DIAGNOSIS — E113391 Type 2 diabetes mellitus with moderate nonproliferative diabetic retinopathy without macular edema, right eye: Secondary | ICD-10-CM | POA: Diagnosis not present

## 2020-12-13 DIAGNOSIS — H35361 Drusen (degenerative) of macula, right eye: Secondary | ICD-10-CM | POA: Diagnosis not present

## 2020-12-13 DIAGNOSIS — H34832 Tributary (branch) retinal vein occlusion, left eye, with macular edema: Secondary | ICD-10-CM | POA: Diagnosis not present

## 2020-12-13 DIAGNOSIS — E113292 Type 2 diabetes mellitus with mild nonproliferative diabetic retinopathy without macular edema, left eye: Secondary | ICD-10-CM | POA: Diagnosis not present

## 2021-01-31 DIAGNOSIS — I10 Essential (primary) hypertension: Secondary | ICD-10-CM | POA: Diagnosis not present

## 2021-01-31 DIAGNOSIS — R609 Edema, unspecified: Secondary | ICD-10-CM | POA: Diagnosis not present

## 2021-03-19 DIAGNOSIS — I70203 Unspecified atherosclerosis of native arteries of extremities, bilateral legs: Secondary | ICD-10-CM | POA: Diagnosis not present

## 2021-03-19 DIAGNOSIS — M2141 Flat foot [pes planus] (acquired), right foot: Secondary | ICD-10-CM | POA: Diagnosis not present

## 2021-03-19 DIAGNOSIS — M76822 Posterior tibial tendinitis, left leg: Secondary | ICD-10-CM | POA: Diagnosis not present

## 2021-03-19 DIAGNOSIS — E1151 Type 2 diabetes mellitus with diabetic peripheral angiopathy without gangrene: Secondary | ICD-10-CM | POA: Diagnosis not present

## 2021-03-19 DIAGNOSIS — M2142 Flat foot [pes planus] (acquired), left foot: Secondary | ICD-10-CM | POA: Diagnosis not present

## 2021-03-19 DIAGNOSIS — M76821 Posterior tibial tendinitis, right leg: Secondary | ICD-10-CM | POA: Diagnosis not present

## 2021-03-28 DIAGNOSIS — E039 Hypothyroidism, unspecified: Secondary | ICD-10-CM | POA: Diagnosis not present

## 2021-03-28 DIAGNOSIS — E113293 Type 2 diabetes mellitus with mild nonproliferative diabetic retinopathy without macular edema, bilateral: Secondary | ICD-10-CM | POA: Diagnosis not present

## 2021-03-28 DIAGNOSIS — I700 Atherosclerosis of aorta: Secondary | ICD-10-CM | POA: Diagnosis not present

## 2021-03-28 DIAGNOSIS — R197 Diarrhea, unspecified: Secondary | ICD-10-CM | POA: Diagnosis not present

## 2021-03-28 DIAGNOSIS — Z0000 Encounter for general adult medical examination without abnormal findings: Secondary | ICD-10-CM | POA: Diagnosis not present

## 2021-03-28 DIAGNOSIS — Z23 Encounter for immunization: Secondary | ICD-10-CM | POA: Diagnosis not present

## 2021-03-28 DIAGNOSIS — M25511 Pain in right shoulder: Secondary | ICD-10-CM | POA: Diagnosis not present

## 2021-03-28 DIAGNOSIS — I10 Essential (primary) hypertension: Secondary | ICD-10-CM | POA: Diagnosis not present

## 2021-04-02 DIAGNOSIS — I10 Essential (primary) hypertension: Secondary | ICD-10-CM | POA: Diagnosis not present

## 2021-04-02 DIAGNOSIS — N189 Chronic kidney disease, unspecified: Secondary | ICD-10-CM | POA: Diagnosis not present

## 2021-04-02 DIAGNOSIS — M899 Disorder of bone, unspecified: Secondary | ICD-10-CM | POA: Diagnosis not present

## 2021-04-02 DIAGNOSIS — E113293 Type 2 diabetes mellitus with mild nonproliferative diabetic retinopathy without macular edema, bilateral: Secondary | ICD-10-CM | POA: Diagnosis not present

## 2021-04-02 DIAGNOSIS — E871 Hypo-osmolality and hyponatremia: Secondary | ICD-10-CM | POA: Diagnosis not present

## 2021-04-11 DIAGNOSIS — H35361 Drusen (degenerative) of macula, right eye: Secondary | ICD-10-CM | POA: Diagnosis not present

## 2021-04-11 DIAGNOSIS — H34832 Tributary (branch) retinal vein occlusion, left eye, with macular edema: Secondary | ICD-10-CM | POA: Diagnosis not present

## 2021-04-11 DIAGNOSIS — E113391 Type 2 diabetes mellitus with moderate nonproliferative diabetic retinopathy without macular edema, right eye: Secondary | ICD-10-CM | POA: Diagnosis not present

## 2021-04-11 DIAGNOSIS — E113292 Type 2 diabetes mellitus with mild nonproliferative diabetic retinopathy without macular edema, left eye: Secondary | ICD-10-CM | POA: Diagnosis not present

## 2021-04-19 DIAGNOSIS — M76821 Posterior tibial tendinitis, right leg: Secondary | ICD-10-CM | POA: Diagnosis not present

## 2021-04-19 DIAGNOSIS — E1151 Type 2 diabetes mellitus with diabetic peripheral angiopathy without gangrene: Secondary | ICD-10-CM | POA: Diagnosis not present

## 2021-04-19 DIAGNOSIS — M21071 Valgus deformity, not elsewhere classified, right ankle: Secondary | ICD-10-CM | POA: Diagnosis not present

## 2021-04-19 DIAGNOSIS — M21072 Valgus deformity, not elsewhere classified, left ankle: Secondary | ICD-10-CM | POA: Diagnosis not present

## 2021-04-19 DIAGNOSIS — M76822 Posterior tibial tendinitis, left leg: Secondary | ICD-10-CM | POA: Diagnosis not present

## 2021-06-19 DIAGNOSIS — E1151 Type 2 diabetes mellitus with diabetic peripheral angiopathy without gangrene: Secondary | ICD-10-CM | POA: Diagnosis not present

## 2021-06-19 DIAGNOSIS — M2142 Flat foot [pes planus] (acquired), left foot: Secondary | ICD-10-CM | POA: Diagnosis not present

## 2021-06-19 DIAGNOSIS — M76822 Posterior tibial tendinitis, left leg: Secondary | ICD-10-CM | POA: Diagnosis not present

## 2021-06-19 DIAGNOSIS — M2141 Flat foot [pes planus] (acquired), right foot: Secondary | ICD-10-CM | POA: Diagnosis not present

## 2021-06-19 DIAGNOSIS — M76821 Posterior tibial tendinitis, right leg: Secondary | ICD-10-CM | POA: Diagnosis not present

## 2021-06-28 DIAGNOSIS — R609 Edema, unspecified: Secondary | ICD-10-CM | POA: Diagnosis not present

## 2021-06-28 DIAGNOSIS — E871 Hypo-osmolality and hyponatremia: Secondary | ICD-10-CM | POA: Diagnosis not present

## 2021-06-28 DIAGNOSIS — I10 Essential (primary) hypertension: Secondary | ICD-10-CM | POA: Diagnosis not present

## 2021-07-11 DIAGNOSIS — H34832 Tributary (branch) retinal vein occlusion, left eye, with macular edema: Secondary | ICD-10-CM | POA: Diagnosis not present

## 2021-07-31 DIAGNOSIS — E1151 Type 2 diabetes mellitus with diabetic peripheral angiopathy without gangrene: Secondary | ICD-10-CM | POA: Diagnosis not present

## 2021-08-17 ENCOUNTER — Other Ambulatory Visit: Payer: Self-pay | Admitting: Family Medicine

## 2021-08-17 DIAGNOSIS — Z1231 Encounter for screening mammogram for malignant neoplasm of breast: Secondary | ICD-10-CM

## 2021-08-20 DIAGNOSIS — M76822 Posterior tibial tendinitis, left leg: Secondary | ICD-10-CM | POA: Diagnosis not present

## 2021-08-20 DIAGNOSIS — E1151 Type 2 diabetes mellitus with diabetic peripheral angiopathy without gangrene: Secondary | ICD-10-CM | POA: Diagnosis not present

## 2021-08-20 DIAGNOSIS — M76821 Posterior tibial tendinitis, right leg: Secondary | ICD-10-CM | POA: Diagnosis not present

## 2021-08-20 DIAGNOSIS — M2141 Flat foot [pes planus] (acquired), right foot: Secondary | ICD-10-CM | POA: Diagnosis not present

## 2021-08-20 DIAGNOSIS — M2142 Flat foot [pes planus] (acquired), left foot: Secondary | ICD-10-CM | POA: Diagnosis not present

## 2021-09-03 ENCOUNTER — Ambulatory Visit
Admission: RE | Admit: 2021-09-03 | Discharge: 2021-09-03 | Disposition: A | Discharge status: Home / Self Care | Payer: Medicare HMO | Source: Ambulatory Visit | Attending: Family Medicine | Admitting: Family Medicine

## 2021-09-03 DIAGNOSIS — Z1231 Encounter for screening mammogram for malignant neoplasm of breast: Secondary | ICD-10-CM | POA: Diagnosis not present

## 2021-09-14 DIAGNOSIS — H5203 Hypermetropia, bilateral: Secondary | ICD-10-CM | POA: Diagnosis not present

## 2021-09-26 DIAGNOSIS — E039 Hypothyroidism, unspecified: Secondary | ICD-10-CM | POA: Diagnosis not present

## 2021-09-26 DIAGNOSIS — I700 Atherosclerosis of aorta: Secondary | ICD-10-CM | POA: Diagnosis not present

## 2021-09-26 DIAGNOSIS — E113293 Type 2 diabetes mellitus with mild nonproliferative diabetic retinopathy without macular edema, bilateral: Secondary | ICD-10-CM | POA: Diagnosis not present

## 2021-09-26 DIAGNOSIS — I10 Essential (primary) hypertension: Secondary | ICD-10-CM | POA: Diagnosis not present

## 2021-09-26 DIAGNOSIS — M461 Sacroiliitis, not elsewhere classified: Secondary | ICD-10-CM | POA: Diagnosis not present

## 2021-09-26 DIAGNOSIS — M792 Neuralgia and neuritis, unspecified: Secondary | ICD-10-CM | POA: Diagnosis not present

## 2021-10-10 DIAGNOSIS — H34832 Tributary (branch) retinal vein occlusion, left eye, with macular edema: Secondary | ICD-10-CM | POA: Diagnosis not present

## 2021-10-10 DIAGNOSIS — H43811 Vitreous degeneration, right eye: Secondary | ICD-10-CM | POA: Diagnosis not present

## 2021-10-10 DIAGNOSIS — E113292 Type 2 diabetes mellitus with mild nonproliferative diabetic retinopathy without macular edema, left eye: Secondary | ICD-10-CM | POA: Diagnosis not present

## 2021-10-10 DIAGNOSIS — E113391 Type 2 diabetes mellitus with moderate nonproliferative diabetic retinopathy without macular edema, right eye: Secondary | ICD-10-CM | POA: Diagnosis not present

## 2021-10-12 DIAGNOSIS — M47816 Spondylosis without myelopathy or radiculopathy, lumbar region: Secondary | ICD-10-CM | POA: Diagnosis not present

## 2021-10-12 DIAGNOSIS — M1612 Unilateral primary osteoarthritis, left hip: Secondary | ICD-10-CM | POA: Diagnosis not present

## 2021-10-22 DIAGNOSIS — M6281 Muscle weakness (generalized): Secondary | ICD-10-CM | POA: Diagnosis not present

## 2021-10-22 DIAGNOSIS — M545 Low back pain, unspecified: Secondary | ICD-10-CM | POA: Diagnosis not present

## 2021-10-22 DIAGNOSIS — M7062 Trochanteric bursitis, left hip: Secondary | ICD-10-CM | POA: Diagnosis not present

## 2021-11-12 DIAGNOSIS — M1612 Unilateral primary osteoarthritis, left hip: Secondary | ICD-10-CM | POA: Diagnosis not present

## 2021-11-13 DIAGNOSIS — M545 Low back pain, unspecified: Secondary | ICD-10-CM | POA: Diagnosis not present

## 2021-11-13 DIAGNOSIS — M6281 Muscle weakness (generalized): Secondary | ICD-10-CM | POA: Diagnosis not present

## 2021-11-13 DIAGNOSIS — M7062 Trochanteric bursitis, left hip: Secondary | ICD-10-CM | POA: Diagnosis not present

## 2021-11-15 DIAGNOSIS — M7062 Trochanteric bursitis, left hip: Secondary | ICD-10-CM | POA: Diagnosis not present

## 2021-11-15 DIAGNOSIS — M545 Low back pain, unspecified: Secondary | ICD-10-CM | POA: Diagnosis not present

## 2021-11-15 DIAGNOSIS — M6281 Muscle weakness (generalized): Secondary | ICD-10-CM | POA: Diagnosis not present

## 2021-11-20 DIAGNOSIS — M7062 Trochanteric bursitis, left hip: Secondary | ICD-10-CM | POA: Diagnosis not present

## 2021-11-20 DIAGNOSIS — M6281 Muscle weakness (generalized): Secondary | ICD-10-CM | POA: Diagnosis not present

## 2021-11-20 DIAGNOSIS — M545 Low back pain, unspecified: Secondary | ICD-10-CM | POA: Diagnosis not present

## 2021-11-22 DIAGNOSIS — M545 Low back pain, unspecified: Secondary | ICD-10-CM | POA: Diagnosis not present

## 2021-11-22 DIAGNOSIS — M6281 Muscle weakness (generalized): Secondary | ICD-10-CM | POA: Diagnosis not present

## 2021-11-22 DIAGNOSIS — M7062 Trochanteric bursitis, left hip: Secondary | ICD-10-CM | POA: Diagnosis not present

## 2021-11-27 DIAGNOSIS — M7062 Trochanteric bursitis, left hip: Secondary | ICD-10-CM | POA: Diagnosis not present

## 2021-11-27 DIAGNOSIS — M545 Low back pain, unspecified: Secondary | ICD-10-CM | POA: Diagnosis not present

## 2021-11-27 DIAGNOSIS — M6281 Muscle weakness (generalized): Secondary | ICD-10-CM | POA: Diagnosis not present

## 2021-11-29 DIAGNOSIS — M7062 Trochanteric bursitis, left hip: Secondary | ICD-10-CM | POA: Diagnosis not present

## 2021-11-29 DIAGNOSIS — M545 Low back pain, unspecified: Secondary | ICD-10-CM | POA: Diagnosis not present

## 2021-11-29 DIAGNOSIS — M6281 Muscle weakness (generalized): Secondary | ICD-10-CM | POA: Diagnosis not present

## 2021-12-07 DIAGNOSIS — M7062 Trochanteric bursitis, left hip: Secondary | ICD-10-CM | POA: Diagnosis not present

## 2021-12-07 DIAGNOSIS — M545 Low back pain, unspecified: Secondary | ICD-10-CM | POA: Diagnosis not present

## 2021-12-07 DIAGNOSIS — M6281 Muscle weakness (generalized): Secondary | ICD-10-CM | POA: Diagnosis not present

## 2021-12-12 DIAGNOSIS — M7062 Trochanteric bursitis, left hip: Secondary | ICD-10-CM | POA: Diagnosis not present

## 2021-12-12 DIAGNOSIS — M545 Low back pain, unspecified: Secondary | ICD-10-CM | POA: Diagnosis not present

## 2021-12-12 DIAGNOSIS — M6281 Muscle weakness (generalized): Secondary | ICD-10-CM | POA: Diagnosis not present

## 2021-12-14 DIAGNOSIS — M545 Low back pain, unspecified: Secondary | ICD-10-CM | POA: Diagnosis not present

## 2021-12-14 DIAGNOSIS — M6281 Muscle weakness (generalized): Secondary | ICD-10-CM | POA: Diagnosis not present

## 2021-12-14 DIAGNOSIS — M7062 Trochanteric bursitis, left hip: Secondary | ICD-10-CM | POA: Diagnosis not present

## 2021-12-17 DIAGNOSIS — M6281 Muscle weakness (generalized): Secondary | ICD-10-CM | POA: Diagnosis not present

## 2021-12-17 DIAGNOSIS — M545 Low back pain, unspecified: Secondary | ICD-10-CM | POA: Diagnosis not present

## 2021-12-17 DIAGNOSIS — M7062 Trochanteric bursitis, left hip: Secondary | ICD-10-CM | POA: Diagnosis not present

## 2021-12-18 DIAGNOSIS — H34832 Tributary (branch) retinal vein occlusion, left eye, with macular edema: Secondary | ICD-10-CM | POA: Diagnosis not present

## 2021-12-18 DIAGNOSIS — H18413 Arcus senilis, bilateral: Secondary | ICD-10-CM | POA: Diagnosis not present

## 2021-12-18 DIAGNOSIS — H2513 Age-related nuclear cataract, bilateral: Secondary | ICD-10-CM | POA: Diagnosis not present

## 2021-12-18 DIAGNOSIS — H25013 Cortical age-related cataract, bilateral: Secondary | ICD-10-CM | POA: Diagnosis not present

## 2021-12-18 DIAGNOSIS — H25043 Posterior subcapsular polar age-related cataract, bilateral: Secondary | ICD-10-CM | POA: Diagnosis not present

## 2021-12-18 DIAGNOSIS — H2512 Age-related nuclear cataract, left eye: Secondary | ICD-10-CM | POA: Diagnosis not present

## 2021-12-19 DIAGNOSIS — E113292 Type 2 diabetes mellitus with mild nonproliferative diabetic retinopathy without macular edema, left eye: Secondary | ICD-10-CM | POA: Diagnosis not present

## 2021-12-19 DIAGNOSIS — H43811 Vitreous degeneration, right eye: Secondary | ICD-10-CM | POA: Diagnosis not present

## 2021-12-19 DIAGNOSIS — E113391 Type 2 diabetes mellitus with moderate nonproliferative diabetic retinopathy without macular edema, right eye: Secondary | ICD-10-CM | POA: Diagnosis not present

## 2021-12-19 DIAGNOSIS — H34832 Tributary (branch) retinal vein occlusion, left eye, with macular edema: Secondary | ICD-10-CM | POA: Diagnosis not present

## 2021-12-21 DIAGNOSIS — M545 Low back pain, unspecified: Secondary | ICD-10-CM | POA: Diagnosis not present

## 2021-12-21 DIAGNOSIS — M7062 Trochanteric bursitis, left hip: Secondary | ICD-10-CM | POA: Diagnosis not present

## 2021-12-21 DIAGNOSIS — M6281 Muscle weakness (generalized): Secondary | ICD-10-CM | POA: Diagnosis not present

## 2021-12-24 DIAGNOSIS — M25552 Pain in left hip: Secondary | ICD-10-CM | POA: Diagnosis not present

## 2021-12-24 DIAGNOSIS — M1612 Unilateral primary osteoarthritis, left hip: Secondary | ICD-10-CM | POA: Diagnosis not present

## 2021-12-25 DIAGNOSIS — M545 Low back pain, unspecified: Secondary | ICD-10-CM | POA: Diagnosis not present

## 2021-12-25 DIAGNOSIS — M7062 Trochanteric bursitis, left hip: Secondary | ICD-10-CM | POA: Diagnosis not present

## 2021-12-25 DIAGNOSIS — M6281 Muscle weakness (generalized): Secondary | ICD-10-CM | POA: Diagnosis not present

## 2021-12-27 DIAGNOSIS — M545 Low back pain, unspecified: Secondary | ICD-10-CM | POA: Diagnosis not present

## 2021-12-27 DIAGNOSIS — M7062 Trochanteric bursitis, left hip: Secondary | ICD-10-CM | POA: Diagnosis not present

## 2021-12-27 DIAGNOSIS — M6281 Muscle weakness (generalized): Secondary | ICD-10-CM | POA: Diagnosis not present

## 2022-01-02 DIAGNOSIS — H2512 Age-related nuclear cataract, left eye: Secondary | ICD-10-CM | POA: Diagnosis not present

## 2022-01-29 DIAGNOSIS — N182 Chronic kidney disease, stage 2 (mild): Secondary | ICD-10-CM | POA: Diagnosis not present

## 2022-01-29 DIAGNOSIS — N2581 Secondary hyperparathyroidism of renal origin: Secondary | ICD-10-CM | POA: Diagnosis not present

## 2022-01-29 DIAGNOSIS — I129 Hypertensive chronic kidney disease with stage 1 through stage 4 chronic kidney disease, or unspecified chronic kidney disease: Secondary | ICD-10-CM | POA: Diagnosis not present

## 2022-01-29 DIAGNOSIS — I10 Essential (primary) hypertension: Secondary | ICD-10-CM | POA: Diagnosis not present

## 2022-01-29 DIAGNOSIS — D631 Anemia in chronic kidney disease: Secondary | ICD-10-CM | POA: Diagnosis not present

## 2022-01-29 DIAGNOSIS — N189 Chronic kidney disease, unspecified: Secondary | ICD-10-CM | POA: Diagnosis not present

## 2022-02-04 DIAGNOSIS — H2512 Age-related nuclear cataract, left eye: Secondary | ICD-10-CM | POA: Diagnosis not present

## 2022-02-27 DIAGNOSIS — H43811 Vitreous degeneration, right eye: Secondary | ICD-10-CM | POA: Diagnosis not present

## 2022-02-27 DIAGNOSIS — H34832 Tributary (branch) retinal vein occlusion, left eye, with macular edema: Secondary | ICD-10-CM | POA: Diagnosis not present

## 2022-02-27 DIAGNOSIS — E113292 Type 2 diabetes mellitus with mild nonproliferative diabetic retinopathy without macular edema, left eye: Secondary | ICD-10-CM | POA: Diagnosis not present

## 2022-02-27 DIAGNOSIS — E113391 Type 2 diabetes mellitus with moderate nonproliferative diabetic retinopathy without macular edema, right eye: Secondary | ICD-10-CM | POA: Diagnosis not present

## 2022-03-06 DIAGNOSIS — Z23 Encounter for immunization: Secondary | ICD-10-CM | POA: Diagnosis not present

## 2022-04-04 DIAGNOSIS — I700 Atherosclerosis of aorta: Secondary | ICD-10-CM | POA: Diagnosis not present

## 2022-04-04 DIAGNOSIS — E113293 Type 2 diabetes mellitus with mild nonproliferative diabetic retinopathy without macular edema, bilateral: Secondary | ICD-10-CM | POA: Diagnosis not present

## 2022-04-04 DIAGNOSIS — Z0000 Encounter for general adult medical examination without abnormal findings: Secondary | ICD-10-CM | POA: Diagnosis not present

## 2022-04-04 DIAGNOSIS — E039 Hypothyroidism, unspecified: Secondary | ICD-10-CM | POA: Diagnosis not present

## 2022-04-04 DIAGNOSIS — I10 Essential (primary) hypertension: Secondary | ICD-10-CM | POA: Diagnosis not present

## 2022-04-22 DIAGNOSIS — M21072 Valgus deformity, not elsewhere classified, left ankle: Secondary | ICD-10-CM | POA: Diagnosis not present

## 2022-04-22 DIAGNOSIS — E1151 Type 2 diabetes mellitus with diabetic peripheral angiopathy without gangrene: Secondary | ICD-10-CM | POA: Diagnosis not present

## 2022-04-22 DIAGNOSIS — M76822 Posterior tibial tendinitis, left leg: Secondary | ICD-10-CM | POA: Diagnosis not present

## 2022-04-22 DIAGNOSIS — I739 Peripheral vascular disease, unspecified: Secondary | ICD-10-CM | POA: Diagnosis not present

## 2022-04-22 DIAGNOSIS — M21071 Valgus deformity, not elsewhere classified, right ankle: Secondary | ICD-10-CM | POA: Diagnosis not present

## 2022-04-22 DIAGNOSIS — M76821 Posterior tibial tendinitis, right leg: Secondary | ICD-10-CM | POA: Diagnosis not present

## 2022-04-29 DIAGNOSIS — I739 Peripheral vascular disease, unspecified: Secondary | ICD-10-CM | POA: Diagnosis not present

## 2022-05-08 DIAGNOSIS — H2513 Age-related nuclear cataract, bilateral: Secondary | ICD-10-CM | POA: Diagnosis not present

## 2022-05-08 DIAGNOSIS — H34832 Tributary (branch) retinal vein occlusion, left eye, with macular edema: Secondary | ICD-10-CM | POA: Diagnosis not present

## 2022-05-28 DIAGNOSIS — M76822 Posterior tibial tendinitis, left leg: Secondary | ICD-10-CM | POA: Diagnosis not present

## 2022-05-28 DIAGNOSIS — I739 Peripheral vascular disease, unspecified: Secondary | ICD-10-CM | POA: Diagnosis not present

## 2022-05-28 DIAGNOSIS — M76821 Posterior tibial tendinitis, right leg: Secondary | ICD-10-CM | POA: Diagnosis not present

## 2022-05-28 DIAGNOSIS — E1151 Type 2 diabetes mellitus with diabetic peripheral angiopathy without gangrene: Secondary | ICD-10-CM | POA: Diagnosis not present

## 2022-07-17 DIAGNOSIS — E113292 Type 2 diabetes mellitus with mild nonproliferative diabetic retinopathy without macular edema, left eye: Secondary | ICD-10-CM | POA: Diagnosis not present

## 2022-07-17 DIAGNOSIS — E113391 Type 2 diabetes mellitus with moderate nonproliferative diabetic retinopathy without macular edema, right eye: Secondary | ICD-10-CM | POA: Diagnosis not present

## 2022-07-17 DIAGNOSIS — H43811 Vitreous degeneration, right eye: Secondary | ICD-10-CM | POA: Diagnosis not present

## 2022-07-17 DIAGNOSIS — H34832 Tributary (branch) retinal vein occlusion, left eye, with macular edema: Secondary | ICD-10-CM | POA: Diagnosis not present

## 2022-07-23 DIAGNOSIS — N182 Chronic kidney disease, stage 2 (mild): Secondary | ICD-10-CM | POA: Diagnosis not present

## 2022-07-26 ENCOUNTER — Other Ambulatory Visit: Payer: Self-pay | Admitting: Family Medicine

## 2022-07-26 DIAGNOSIS — Z1231 Encounter for screening mammogram for malignant neoplasm of breast: Secondary | ICD-10-CM

## 2022-07-30 DIAGNOSIS — E871 Hypo-osmolality and hyponatremia: Secondary | ICD-10-CM | POA: Diagnosis not present

## 2022-07-30 DIAGNOSIS — I129 Hypertensive chronic kidney disease with stage 1 through stage 4 chronic kidney disease, or unspecified chronic kidney disease: Secondary | ICD-10-CM | POA: Diagnosis not present

## 2022-07-30 DIAGNOSIS — D631 Anemia in chronic kidney disease: Secondary | ICD-10-CM | POA: Diagnosis not present

## 2022-07-30 DIAGNOSIS — N2581 Secondary hyperparathyroidism of renal origin: Secondary | ICD-10-CM | POA: Diagnosis not present

## 2022-07-30 DIAGNOSIS — N182 Chronic kidney disease, stage 2 (mild): Secondary | ICD-10-CM | POA: Diagnosis not present

## 2022-08-26 DIAGNOSIS — C50911 Malignant neoplasm of unspecified site of right female breast: Secondary | ICD-10-CM | POA: Diagnosis not present

## 2022-08-27 DIAGNOSIS — E1151 Type 2 diabetes mellitus with diabetic peripheral angiopathy without gangrene: Secondary | ICD-10-CM | POA: Diagnosis not present

## 2022-08-27 DIAGNOSIS — M76822 Posterior tibial tendinitis, left leg: Secondary | ICD-10-CM | POA: Diagnosis not present

## 2022-08-27 DIAGNOSIS — M76821 Posterior tibial tendinitis, right leg: Secondary | ICD-10-CM | POA: Diagnosis not present

## 2022-08-27 DIAGNOSIS — I739 Peripheral vascular disease, unspecified: Secondary | ICD-10-CM | POA: Diagnosis not present

## 2022-08-27 DIAGNOSIS — I70203 Unspecified atherosclerosis of native arteries of extremities, bilateral legs: Secondary | ICD-10-CM | POA: Diagnosis not present

## 2022-09-10 ENCOUNTER — Ambulatory Visit
Admission: RE | Admit: 2022-09-10 | Discharge: 2022-09-10 | Disposition: A | Discharge status: Home / Self Care | Payer: Medicare HMO | Source: Ambulatory Visit | Attending: Family Medicine | Admitting: Family Medicine

## 2022-09-10 DIAGNOSIS — Z1231 Encounter for screening mammogram for malignant neoplasm of breast: Secondary | ICD-10-CM | POA: Diagnosis not present

## 2022-09-17 DIAGNOSIS — C50911 Malignant neoplasm of unspecified site of right female breast: Secondary | ICD-10-CM | POA: Diagnosis not present

## 2022-09-25 DIAGNOSIS — H34832 Tributary (branch) retinal vein occlusion, left eye, with macular edema: Secondary | ICD-10-CM | POA: Diagnosis not present

## 2022-09-25 DIAGNOSIS — H43811 Vitreous degeneration, right eye: Secondary | ICD-10-CM | POA: Diagnosis not present

## 2022-09-25 DIAGNOSIS — E113391 Type 2 diabetes mellitus with moderate nonproliferative diabetic retinopathy without macular edema, right eye: Secondary | ICD-10-CM | POA: Diagnosis not present

## 2022-09-25 DIAGNOSIS — E113292 Type 2 diabetes mellitus with mild nonproliferative diabetic retinopathy without macular edema, left eye: Secondary | ICD-10-CM | POA: Diagnosis not present

## 2022-10-04 DIAGNOSIS — D179 Benign lipomatous neoplasm, unspecified: Secondary | ICD-10-CM | POA: Diagnosis not present

## 2022-10-04 DIAGNOSIS — E113293 Type 2 diabetes mellitus with mild nonproliferative diabetic retinopathy without macular edema, bilateral: Secondary | ICD-10-CM | POA: Diagnosis not present

## 2022-10-04 DIAGNOSIS — M461 Sacroiliitis, not elsewhere classified: Secondary | ICD-10-CM | POA: Diagnosis not present

## 2022-10-04 DIAGNOSIS — I700 Atherosclerosis of aorta: Secondary | ICD-10-CM | POA: Diagnosis not present

## 2022-10-04 DIAGNOSIS — I10 Essential (primary) hypertension: Secondary | ICD-10-CM | POA: Diagnosis not present

## 2022-10-04 DIAGNOSIS — E039 Hypothyroidism, unspecified: Secondary | ICD-10-CM | POA: Diagnosis not present

## 2022-10-04 DIAGNOSIS — J309 Allergic rhinitis, unspecified: Secondary | ICD-10-CM | POA: Diagnosis not present

## 2022-10-04 DIAGNOSIS — M792 Neuralgia and neuritis, unspecified: Secondary | ICD-10-CM | POA: Diagnosis not present

## 2022-11-14 ENCOUNTER — Ambulatory Visit: Payer: Self-pay | Admitting: Surgery

## 2022-11-14 DIAGNOSIS — D171 Benign lipomatous neoplasm of skin and subcutaneous tissue of trunk: Secondary | ICD-10-CM | POA: Diagnosis not present

## 2022-11-14 NOTE — H&P (Signed)
Karmina Matelski [id number]   Referring Provider:  Benita Stabile, MD   Subjective   Chief Complaint: New Consultation (Lipoma of the upper back/shoulder )     History of Present Illness: 77 year old woman with history of diabetes with retinopathy, hypertension, hypothyroidism, aortic atherosclerosis, sacroiliitis, right breast cancer (chemo and radiation 1999) and arthritis presents for evaluation of a lipoma on her shoulder.  This was assessed at recent PCP visit and was measured about 10 x 12 cm along the right upper posterior shoulder.  She states this has been present for many years and has very slowly increased in size.  Recently it has become tender and uncomfortable.     Review of Systems: A complete review of systems was obtained from the patient.  I have reviewed this information and discussed as appropriate with the patient.  See HPI as well for other ROS.   Medical History: Past Medical History:  Diagnosis Date   Arthritis    Chronic kidney disease    Diabetes mellitus without complication (CMS/HHS-HCC)    History of cancer    Hypertension    Thyroid disease     There is no problem list on file for this patient.   Past Surgical History:  Procedure Laterality Date   AJCC BREAST CANCER STAGE I: T1MIC, T1A OR T1B (TUMOR SIZE < 1 CM) DOCUMENTED (ONC)     JOINT REPLACEMENT     LOBECTOMY PARTIAL THYROID       Allergies  Allergen Reactions   Tramadol Shortness Of Breath   Aliskiren Muscle Pain    Foot pain    Amlodipine Other (See Comments)   Clonidine Swelling   Codeine Other (See Comments)    "makes me feel funny"   Diclofenac Other (See Comments)   Diltiazem Swelling   Indapamide Other (See Comments)   Lisinopril Cough   Losartan Cough   Metoprolol Swelling   Spironolactone Nausea   Verapamil Headache    Current Outpatient Medications on File Prior to Visit  Medication Sig Dispense Refill   acetaminophen (TYLENOL) 500 MG tablet Take  by mouth     atorvastatin (LIPITOR) 10 MG tablet Take 10 mg by mouth once daily     fexofenadine (ALLEGRA) 180 MG tablet Take by mouth     hydrALAZINE (APRESOLINE) 25 MG tablet Take 25 mg by mouth 3 (three) times daily     levothyroxine (SYNTHROID) 50 MCG tablet TAKE 1 TABLET IN THE MORNING ON AN EMPTY STOMACH BY MOUTH ONCE A DAY     metFORMIN (GLUCOPHAGE) 500 MG tablet 2 TABLETS WITH FOOD BY MOUTH TWICE A DAY 90 DAY(S)     nebivoloL (BYSTOLIC) 10 MG tablet Take 10 mg by mouth once daily     telmisartan (MICARDIS) 80 MG tablet Take 80 mg by mouth once daily     vitamin E 400 UNIT capsule Take 400 Units by mouth once daily     No current facility-administered medications on file prior to visit.    Family History  Problem Relation Age of Onset   Stroke Mother    High blood pressure (Hypertension) Mother    Coronary Artery Disease (Blocked arteries around heart) Father    Stroke Brother    High blood pressure (Hypertension) Brother    Coronary Artery Disease (Blocked arteries around heart) Brother    Diabetes Brother      Social History   Tobacco Use  Smoking Status Former   Types: Cigarettes  Smokeless Tobacco Never  Social History   Socioeconomic History   Marital status: Married  Tobacco Use   Smoking status: Former    Types: Cigarettes   Smokeless tobacco: Never  Substance and Sexual Activity   Alcohol use: Not Currently   Drug use: Never    Objective:    Vitals:   11/14/22 1006  BP: (!) 164/81  Pulse: 77  Temp: 36.1 C (97 F)  SpO2: 97%  Weight: 73.9 kg (163 lb)  Height: 165.1 cm (5\' 5" )    Body mass index is 27.12 kg/m.  Gen: A&Ox3, no distress  Unlabored respirations Along the right posterior lateral shoulder is a smooth soft,, mobile subcutaneous mass approximately 10 x 12 cm without overlying skin change   Assessment and Plan:  Diagnoses and all orders for this visit:  Benign lipomatous neoplasm of skin and subcutaneous tissue of  trunk  We discussed excision and went over risks of bleeding, infection, pain, scarring, injury to underlying structures, seroma/hematoma, poor wound healing or undesired cosmetic result, recurrence of the lesion.  Questions welcomed and answered.  She wishes to proceed.  Kolby Schara Carlye Grippe, MD

## 2022-12-04 DIAGNOSIS — E113293 Type 2 diabetes mellitus with mild nonproliferative diabetic retinopathy without macular edema, bilateral: Secondary | ICD-10-CM | POA: Diagnosis not present

## 2022-12-04 DIAGNOSIS — H43811 Vitreous degeneration, right eye: Secondary | ICD-10-CM | POA: Diagnosis not present

## 2022-12-04 DIAGNOSIS — H34832 Tributary (branch) retinal vein occlusion, left eye, with macular edema: Secondary | ICD-10-CM | POA: Diagnosis not present

## 2022-12-04 DIAGNOSIS — H35361 Drusen (degenerative) of macula, right eye: Secondary | ICD-10-CM | POA: Diagnosis not present

## 2022-12-05 DIAGNOSIS — M21072 Valgus deformity, not elsewhere classified, left ankle: Secondary | ICD-10-CM | POA: Diagnosis not present

## 2022-12-05 DIAGNOSIS — M76821 Posterior tibial tendinitis, right leg: Secondary | ICD-10-CM | POA: Diagnosis not present

## 2022-12-05 DIAGNOSIS — M21071 Valgus deformity, not elsewhere classified, right ankle: Secondary | ICD-10-CM | POA: Diagnosis not present

## 2022-12-05 DIAGNOSIS — M76822 Posterior tibial tendinitis, left leg: Secondary | ICD-10-CM | POA: Diagnosis not present

## 2022-12-05 DIAGNOSIS — I739 Peripheral vascular disease, unspecified: Secondary | ICD-10-CM | POA: Diagnosis not present

## 2022-12-05 DIAGNOSIS — E1151 Type 2 diabetes mellitus with diabetic peripheral angiopathy without gangrene: Secondary | ICD-10-CM | POA: Diagnosis not present

## 2022-12-16 ENCOUNTER — Encounter (HOSPITAL_BASED_OUTPATIENT_CLINIC_OR_DEPARTMENT_OTHER): Payer: Self-pay | Admitting: Surgery

## 2022-12-24 ENCOUNTER — Encounter (HOSPITAL_BASED_OUTPATIENT_CLINIC_OR_DEPARTMENT_OTHER): Payer: Self-pay | Admitting: Surgery

## 2022-12-24 ENCOUNTER — Other Ambulatory Visit: Payer: Self-pay

## 2022-12-24 NOTE — Progress Notes (Signed)
Spoke w/ via phone for pre-op interview---pt Lab needs dos----   I stat, ekg            Lab results------none COVID test -----patient states asymptomatic no test needed Arrive at -------730 am 01-03-2023 NPO after MN NO Solid Food.  Clear liquids from MN until---630 am Med rec completed Medications to take morning of surgery -----hydrazine, levothyroxine,nebivolol, allegra Diabetic medication -----none day of surgery Patient instructed no nail polish to be worn day of surgery Patient instructed to bring photo id and insurance card day of surgery Patient aware to have Driver (ride ) / caregiver   husband [name]   for 24 hours after surgery  Patient Special Instructions -----none Pre-Op special Instructions -----none Patient verbalized understanding of instructions that were given at this phone interview. Patient denies shortness of breath, chest pain, fever, cough at this phone interview.

## 2023-01-03 ENCOUNTER — Ambulatory Visit (HOSPITAL_BASED_OUTPATIENT_CLINIC_OR_DEPARTMENT_OTHER)
Admission: RE | Admit: 2023-01-03 | Discharge: 2023-01-03 | Disposition: A | Discharge status: Home / Self Care | Payer: Medicare HMO | Attending: Surgery | Admitting: Surgery

## 2023-01-03 ENCOUNTER — Ambulatory Visit (HOSPITAL_BASED_OUTPATIENT_CLINIC_OR_DEPARTMENT_OTHER): Payer: Medicare HMO | Admitting: Certified Registered"

## 2023-01-03 ENCOUNTER — Other Ambulatory Visit: Payer: Self-pay

## 2023-01-03 ENCOUNTER — Encounter (HOSPITAL_BASED_OUTPATIENT_CLINIC_OR_DEPARTMENT_OTHER)
Admission: RE | Disposition: A | Discharge status: Home / Self Care | Payer: Self-pay | Source: Home / Self Care | Attending: Surgery

## 2023-01-03 ENCOUNTER — Encounter (HOSPITAL_BASED_OUTPATIENT_CLINIC_OR_DEPARTMENT_OTHER): Payer: Self-pay | Admitting: Surgery

## 2023-01-03 DIAGNOSIS — N189 Chronic kidney disease, unspecified: Secondary | ICD-10-CM | POA: Insufficient documentation

## 2023-01-03 DIAGNOSIS — D1721 Benign lipomatous neoplasm of skin and subcutaneous tissue of right arm: Secondary | ICD-10-CM | POA: Diagnosis not present

## 2023-01-03 DIAGNOSIS — M1990 Unspecified osteoarthritis, unspecified site: Secondary | ICD-10-CM | POA: Insufficient documentation

## 2023-01-03 DIAGNOSIS — E039 Hypothyroidism, unspecified: Secondary | ICD-10-CM | POA: Insufficient documentation

## 2023-01-03 DIAGNOSIS — Z79899 Other long term (current) drug therapy: Secondary | ICD-10-CM | POA: Insufficient documentation

## 2023-01-03 DIAGNOSIS — Z87891 Personal history of nicotine dependence: Secondary | ICD-10-CM | POA: Diagnosis not present

## 2023-01-03 DIAGNOSIS — Z923 Personal history of irradiation: Secondary | ICD-10-CM | POA: Diagnosis not present

## 2023-01-03 DIAGNOSIS — Z9221 Personal history of antineoplastic chemotherapy: Secondary | ICD-10-CM | POA: Insufficient documentation

## 2023-01-03 DIAGNOSIS — Z7984 Long term (current) use of oral hypoglycemic drugs: Secondary | ICD-10-CM | POA: Insufficient documentation

## 2023-01-03 DIAGNOSIS — R2231 Localized swelling, mass and lump, right upper limb: Secondary | ICD-10-CM | POA: Diagnosis not present

## 2023-01-03 DIAGNOSIS — E1122 Type 2 diabetes mellitus with diabetic chronic kidney disease: Secondary | ICD-10-CM | POA: Diagnosis not present

## 2023-01-03 DIAGNOSIS — Z8249 Family history of ischemic heart disease and other diseases of the circulatory system: Secondary | ICD-10-CM | POA: Insufficient documentation

## 2023-01-03 DIAGNOSIS — Z853 Personal history of malignant neoplasm of breast: Secondary | ICD-10-CM | POA: Diagnosis not present

## 2023-01-03 DIAGNOSIS — I129 Hypertensive chronic kidney disease with stage 1 through stage 4 chronic kidney disease, or unspecified chronic kidney disease: Secondary | ICD-10-CM | POA: Insufficient documentation

## 2023-01-03 DIAGNOSIS — Z833 Family history of diabetes mellitus: Secondary | ICD-10-CM | POA: Insufficient documentation

## 2023-01-03 DIAGNOSIS — R229 Localized swelling, mass and lump, unspecified: Secondary | ICD-10-CM | POA: Diagnosis present

## 2023-01-03 DIAGNOSIS — I10 Essential (primary) hypertension: Secondary | ICD-10-CM | POA: Diagnosis not present

## 2023-01-03 DIAGNOSIS — D179 Benign lipomatous neoplasm, unspecified: Secondary | ICD-10-CM | POA: Insufficient documentation

## 2023-01-03 DIAGNOSIS — Z01818 Encounter for other preprocedural examination: Secondary | ICD-10-CM

## 2023-01-03 DIAGNOSIS — I700 Atherosclerosis of aorta: Secondary | ICD-10-CM | POA: Insufficient documentation

## 2023-01-03 DIAGNOSIS — D171 Benign lipomatous neoplasm of skin and subcutaneous tissue of trunk: Secondary | ICD-10-CM | POA: Diagnosis not present

## 2023-01-03 DIAGNOSIS — E11319 Type 2 diabetes mellitus with unspecified diabetic retinopathy without macular edema: Secondary | ICD-10-CM | POA: Insufficient documentation

## 2023-01-03 DIAGNOSIS — N181 Chronic kidney disease, stage 1: Secondary | ICD-10-CM | POA: Diagnosis not present

## 2023-01-03 HISTORY — DX: Benign lipomatous neoplasm, unspecified: D17.9

## 2023-01-03 HISTORY — DX: Hypothyroidism, unspecified: E03.9

## 2023-01-03 HISTORY — DX: Chronic kidney disease, unspecified: N18.9

## 2023-01-03 HISTORY — PX: LIPOMA EXCISION: SHX5283

## 2023-01-03 LAB — GLUCOSE, CAPILLARY: Glucose-Capillary: 82 mg/dL (ref 70–99)

## 2023-01-03 LAB — POCT I-STAT, CHEM 8
BUN: 17 mg/dL (ref 8–23)
Calcium, Ion: 1.12 mmol/L — ABNORMAL LOW (ref 1.15–1.40)
Chloride: 104 mmol/L (ref 98–111)
Creatinine, Ser: 1.1 mg/dL — ABNORMAL HIGH (ref 0.44–1.00)
Glucose, Bld: 84 mg/dL (ref 70–99)
HCT: 36 % (ref 36.0–46.0)
Hemoglobin: 12.2 g/dL (ref 12.0–15.0)
Potassium: 3.9 mmol/L (ref 3.5–5.1)
Sodium: 137 mmol/L (ref 135–145)
TCO2: 24 mmol/L (ref 22–32)

## 2023-01-03 SURGERY — EXCISION LIPOMA
Anesthesia: General | Site: Shoulder | Laterality: Right

## 2023-01-03 MED ORDER — CEFAZOLIN SODIUM-DEXTROSE 2-4 GM/100ML-% IV SOLN
INTRAVENOUS | Status: AC
  Filled 2023-01-03: qty 100

## 2023-01-03 MED ORDER — LIDOCAINE HCL (PF) 2 % IJ SOLN
INTRAMUSCULAR | Status: AC
  Filled 2023-01-03: qty 5

## 2023-01-03 MED ORDER — CHLORHEXIDINE GLUCONATE 4 % EX SOLN: 60.0000 mL | Freq: Once | CUTANEOUS | Status: DC

## 2023-01-03 MED ORDER — 0.9 % SODIUM CHLORIDE (POUR BTL) OPTIME
TOPICAL | Status: DC | PRN
  Administered 2023-01-03: 500 mL

## 2023-01-03 MED ORDER — ONDANSETRON HCL 4 MG/2ML IJ SOLN
INTRAMUSCULAR | Status: DC | PRN
  Administered 2023-01-03: 4 mg via INTRAVENOUS

## 2023-01-03 MED ORDER — PHENYLEPHRINE 80 MCG/ML (10ML) SYRINGE FOR IV PUSH (FOR BLOOD PRESSURE SUPPORT)
PREFILLED_SYRINGE | INTRAVENOUS | Status: AC
  Filled 2023-01-03: qty 10

## 2023-01-03 MED ORDER — ONDANSETRON HCL 4 MG/2ML IJ SOLN
INTRAMUSCULAR | Status: AC
  Filled 2023-01-03: qty 2

## 2023-01-03 MED ORDER — BUPIVACAINE-EPINEPHRINE 0.25% -1:200000 IJ SOLN
INTRAMUSCULAR | Status: DC | PRN
  Administered 2023-01-03: 20 mL

## 2023-01-03 MED ORDER — FENTANYL CITRATE (PF) 100 MCG/2ML IJ SOLN
INTRAMUSCULAR | Status: DC | PRN
  Administered 2023-01-03 (×2): 50 ug via INTRAVENOUS

## 2023-01-03 MED ORDER — ACETAMINOPHEN 500 MG PO TABS
1000.0000 mg | ORAL_TABLET | ORAL | Status: AC
  Administered 2023-01-03: 1000 mg via ORAL

## 2023-01-03 MED ORDER — OXYCODONE HCL 5 MG PO TABS: 5.0000 mg | ORAL_TABLET | Freq: Three times a day (TID) | ORAL | 0 refills | Status: AC | PRN

## 2023-01-03 MED ORDER — PHENYLEPHRINE 80 MCG/ML (10ML) SYRINGE FOR IV PUSH (FOR BLOOD PRESSURE SUPPORT)
PREFILLED_SYRINGE | INTRAVENOUS | Status: DC | PRN
  Administered 2023-01-03 (×3): 160 ug via INTRAVENOUS

## 2023-01-03 MED ORDER — DEXAMETHASONE SODIUM PHOSPHATE 10 MG/ML IJ SOLN
INTRAMUSCULAR | Status: AC
  Filled 2023-01-03: qty 1

## 2023-01-03 MED ORDER — CEFAZOLIN SODIUM-DEXTROSE 2-4 GM/100ML-% IV SOLN
2.0000 g | INTRAVENOUS | Status: AC
  Administered 2023-01-03: 2 g via INTRAVENOUS

## 2023-01-03 MED ORDER — DOCUSATE SODIUM 100 MG PO CAPS: 100.0000 mg | ORAL_CAPSULE | Freq: Two times a day (BID) | ORAL | 0 refills | Status: AC

## 2023-01-03 MED ORDER — LACTATED RINGERS IV SOLN
INTRAVENOUS | Status: DC
  Administered 2023-01-03: 1000 mL via INTRAVENOUS

## 2023-01-03 MED ORDER — EPHEDRINE SULFATE-NACL 50-0.9 MG/10ML-% IV SOSY
PREFILLED_SYRINGE | INTRAVENOUS | Status: DC | PRN
  Administered 2023-01-03 (×2): 10 mg via INTRAVENOUS

## 2023-01-03 MED ORDER — FENTANYL CITRATE (PF) 100 MCG/2ML IJ SOLN
INTRAMUSCULAR | Status: AC
  Filled 2023-01-03: qty 2

## 2023-01-03 MED ORDER — PROPOFOL 10 MG/ML IV BOLUS
INTRAVENOUS | Status: DC | PRN
  Administered 2023-01-03: 120 mg via INTRAVENOUS

## 2023-01-03 MED ORDER — PROPOFOL 10 MG/ML IV BOLUS
INTRAVENOUS | Status: AC
  Filled 2023-01-03: qty 20

## 2023-01-03 MED ORDER — ACETAMINOPHEN 500 MG PO TABS
ORAL_TABLET | ORAL | Status: AC
  Filled 2023-01-03: qty 2

## 2023-01-03 MED ORDER — EPHEDRINE 5 MG/ML INJ
INTRAVENOUS | Status: AC
  Filled 2023-01-03: qty 5

## 2023-01-03 MED ORDER — DEXAMETHASONE SODIUM PHOSPHATE 10 MG/ML IJ SOLN
INTRAMUSCULAR | Status: DC | PRN
  Administered 2023-01-03: 10 mg via INTRAVENOUS

## 2023-01-03 MED ORDER — LIDOCAINE 2% (20 MG/ML) 5 ML SYRINGE
INTRAMUSCULAR | Status: DC | PRN
  Administered 2023-01-03: 80 mg via INTRAVENOUS

## 2023-01-03 SURGICAL SUPPLY — 51 items
ADH SKN CLS APL DERMABOND .7 (GAUZE/BANDAGES/DRESSINGS)
APL PRP STRL LF DISP 70% ISPRP (MISCELLANEOUS) ×1
APL SKNCLS STERI-STRIP NONHPOA (GAUZE/BANDAGES/DRESSINGS) ×1
BENZOIN TINCTURE PRP APPL 2/3 (GAUZE/BANDAGES/DRESSINGS) IMPLANT
BLADE SURG 15 STRL LF DISP TIS (BLADE) ×1 IMPLANT
BLADE SURG 15 STRL SS (BLADE) ×1
BRIEF MESH DISP LRG (UNDERPADS AND DIAPERS) IMPLANT
CHLORAPREP W/TINT 26 (MISCELLANEOUS) IMPLANT
COVER BACK TABLE 60X90IN (DRAPES) ×1 IMPLANT
COVER MAYO STAND STRL (DRAPES) ×1 IMPLANT
DERMABOND ADVANCED .7 DNX12 (GAUZE/BANDAGES/DRESSINGS) IMPLANT
DRAPE LAPAROTOMY 100X72 PEDS (DRAPES) IMPLANT
DRAPE LAPAROTOMY TRNSV 102X78 (DRAPES) IMPLANT
DRAPE UTILITY XL STRL (DRAPES) ×1 IMPLANT
ELECT REM PT RETURN 9FT ADLT (ELECTROSURGICAL) ×1
ELECTRODE REM PT RTRN 9FT ADLT (ELECTROSURGICAL) ×1 IMPLANT
GAUZE 4X4 16PLY ~~LOC~~+RFID DBL (SPONGE) ×1 IMPLANT
GAUZE PAD ABD 8X10 STRL (GAUZE/BANDAGES/DRESSINGS) IMPLANT
GAUZE SPONGE 4X4 12PLY STRL (GAUZE/BANDAGES/DRESSINGS) ×1 IMPLANT
GAUZE SPONGE 4X4 12PLY STRL LF (GAUZE/BANDAGES/DRESSINGS) IMPLANT
GLOVE BIO SURGEON STRL SZ 6 (GLOVE) ×1 IMPLANT
GLOVE INDICATOR 6.5 STRL GRN (GLOVE) ×1 IMPLANT
GOWN STRL REUS W/TWL LRG LVL3 (GOWN DISPOSABLE) ×1 IMPLANT
KIT SIGMOIDOSCOPE (SET/KITS/TRAYS/PACK) IMPLANT
KIT TURNOVER CYSTO (KITS) ×1 IMPLANT
NDL HYPO 22X1.5 SAFETY MO (MISCELLANEOUS) IMPLANT
NDL HYPO 25X1 1.5 SAFETY (NEEDLE) IMPLANT
NEEDLE HYPO 22X1.5 SAFETY MO (MISCELLANEOUS) ×1 IMPLANT
NEEDLE HYPO 25X1 1.5 SAFETY (NEEDLE) IMPLANT
NS IRRIG 500ML POUR BTL (IV SOLUTION) IMPLANT
PACK BASIN DAY SURGERY FS (CUSTOM PROCEDURE TRAY) ×1 IMPLANT
PENCIL SMOKE EVACUATOR (MISCELLANEOUS) ×1 IMPLANT
SLEEVE SCD COMPRESS KNEE MED (STOCKING) ×1 IMPLANT
SPONGE T-LAP 18X18 ~~LOC~~+RFID (SPONGE) IMPLANT
SPONGE T-LAP 4X18 ~~LOC~~+RFID (SPONGE) IMPLANT
STAPLER VISISTAT 35W (STAPLE) IMPLANT
STRIP CLOSURE SKIN 1/2X4 (GAUZE/BANDAGES/DRESSINGS) IMPLANT
SUCTION TUBE FRAZIER 10FR DISP (SUCTIONS) IMPLANT
SUT CHROMIC 3 0 SH 27 (SUTURE) IMPLANT
SUT MNCRL AB 4-0 PS2 18 (SUTURE) ×1 IMPLANT
SUT VIC AB 2-0 SH 27 (SUTURE)
SUT VIC AB 2-0 SH 27XBRD (SUTURE) IMPLANT
SUT VIC AB 3-0 SH 27 (SUTURE) ×1
SUT VIC AB 3-0 SH 27X BRD (SUTURE) ×1 IMPLANT
SYR BULB IRRIG 60ML STRL (SYRINGE) ×1 IMPLANT
SYR CONTROL 10ML LL (SYRINGE) ×1 IMPLANT
TAPE CLOTH SURG 4X10 WHT LF (GAUZE/BANDAGES/DRESSINGS) IMPLANT
TOWEL OR 17X24 6PK STRL BLUE (TOWEL DISPOSABLE) ×1 IMPLANT
TRAY DSU PREP LF (CUSTOM PROCEDURE TRAY) IMPLANT
TUBE CONNECTING 12X1/4 (SUCTIONS) ×1 IMPLANT
YANKAUER SUCT BULB TIP NO VENT (SUCTIONS) ×1 IMPLANT

## 2023-01-03 NOTE — Op Note (Signed)
Operative Note  [name]  [id number]  [id number]  01/03/2023   Surgeon: Phylliss Blakes MD FACS   Procedure performed: excision of subcutaneous/ intramuscular 10x8x4cm mass, right shoulder with intermediate layered closure   Preop diagnosis: Subcutaneous mass Post-op diagnosis/intraop findings: Intramuscular lipoma   Specimens: Right shoulder mass Retained items: no  EBL: Minimal cc Complications: none   Description of procedure: After obtaining informed consent the patient was taken to the operating room and placed supine on operating room table where general LMA anesthesia was initiated, preoperative antibiotics were administered, SCDs applied, and a formal timeout was performed.  The patient was repositioned in the left lateral decubitus position with all pressure points padded appropriately.  The right shoulder was prepped and draped in usual sterile fashion.  After infiltration with local, an incision was made along the Langer's lines in the right shoulder and cautery used to dissect the soft tissues until the surface of the mass was encountered.  This appeared to be a well encapsulated but lobulated lipoma.  Continued blunt dissection and cautery was used to further freed this up its attachments to the surrounding soft tissue.  The mass was found to have an intramuscular component along the trapezius requiring division of some thin strands of muscle tissue.  Hemostasis was ensured.  The mass was excised intact and then handed off for pathology.  The wound was inspected and hemostasis was excellent.  The incision was closed in multiple layers, with interrupted 3-0 Vicryl in the deep soft tissue to close down dead space followed by interrupted deep dermal 3-0 Vicryl's and running subcuticular 4-0 Monocryl.  Benzoin, Steri-Strips and a light pressure dressing of 4 x 4's and tape were applied.  The patient was then awakened, extubated and taken to PACU in stable condition.    All counts  were correct at the completion of the case.

## 2023-01-03 NOTE — Anesthesia Procedure Notes (Signed)
Procedure Name: LMA Insertion Date/Time: 01/03/2023 9:27 AM  Performed by: Keyli Duross D, CRNAPre-anesthesia Checklist: Patient identified, Emergency Drugs available, Suction available and Patient being monitored Patient Re-evaluated:Patient Re-evaluated prior to induction Oxygen Delivery Method: Circle system utilized Preoxygenation: Pre-oxygenation with 100% oxygen Induction Type: IV induction Ventilation: Mask ventilation without difficulty LMA: LMA inserted LMA Size: 4.0 Tube type: Oral Number of attempts: 1 Placement Confirmation: positive ETCO2 and breath sounds checked- equal and bilateral Tube secured with: Tape Dental Injury: Teeth and Oropharynx as per pre-operative assessment

## 2023-01-03 NOTE — Transfer of Care (Signed)
Immediate Anesthesia Transfer of Care Note  Patient: [name]  Procedure(s) Performed: EXCISION OF RIGHT SHOULDER MASS (Right: Shoulder)  Patient Location: PACU  Anesthesia Type:General  Level of Consciousness: awake, alert , and oriented  Airway & Oxygen Therapy: Patient Spontanous Breathing and Patient connected to nasal cannula oxygen  Post-op Assessment: Report given to RN and Post -op Vital signs reviewed and stable  Post vital signs: Reviewed and stable  Last Vitals:  Vitals Value Taken Time  BP    Temp    Pulse 60 01/03/23 1021  Resp 14 01/03/23 1021  SpO2 100 % 01/03/23 1021  Vitals shown include unfiled device data.  Last Pain:  Vitals:   01/03/23 0828  TempSrc: Oral  PainSc: 0-No pain      Patients Stated Pain Goal: 7 (01/03/23 6213)  Complications: No notable events documented.

## 2023-01-03 NOTE — Anesthesia Preprocedure Evaluation (Addendum)
Anesthesia Evaluation  Patient identified by MRN, date of birth, ID band Patient awake    Reviewed: Allergy & Precautions, H&P , NPO status , Patient's Chart, lab work & pertinent test results  History of Anesthesia Complications (+) PONV and history of anesthetic complications  Airway Mallampati: II  TM Distance: >3 FB Neck ROM: Full    Dental  (+) Partial Lower, Partial Upper   Pulmonary former smoker   Pulmonary exam normal breath sounds clear to auscultation       Cardiovascular hypertension, Pt. on medications Normal cardiovascular exam Rhythm:Regular Rate:Normal     Neuro/Psych negative neurological ROS  negative psych ROS   GI/Hepatic negative GI ROS, Neg liver ROS,,,  Endo/Other  diabetes, Type 2Hypothyroidism    Renal/GU Renal InsufficiencyRenal disease  negative genitourinary   Musculoskeletal negative musculoskeletal ROS (+)    Abdominal   Peds negative pediatric ROS (+)  Hematology negative hematology ROS (+)   Anesthesia Other Findings   Reproductive/Obstetrics negative OB ROS                             Anesthesia Physical Anesthesia Plan  ASA: 3  Anesthesia Plan: General   Post-op Pain Management: Tylenol PO (pre-op)*   Induction: Intravenous  PONV Risk Score and Plan: 4 or greater and Ondansetron, Dexamethasone, Midazolam, Droperidol and Treatment may vary due to age or medical condition  Airway Management Planned: LMA  Additional Equipment:   Intra-op Plan:   Post-operative Plan: Extubation in OR  Informed Consent: I have reviewed the patients History and Physical, chart, labs and discussed the procedure including the risks, benefits and alternatives for the proposed anesthesia with the patient or authorized representative who has indicated his/her understanding and acceptance.     Dental advisory given  Plan Discussed with: CRNA  Anesthesia Plan  Comments:        Anesthesia Quick Evaluation

## 2023-01-03 NOTE — H&P (Signed)
[name] [id number]   Referring Provider:  Benita Stabile, MD   Subjective   Chief Complaint: New Consultation (Lipoma of the upper back/shoulder )     History of Present Illness: 77 year old woman with history of diabetes with retinopathy, hypertension, hypothyroidism, aortic atherosclerosis, sacroiliitis, right breast cancer (chemo and radiation 1999) and arthritis presents for evaluation of a lipoma on her shoulder.  This was assessed at recent PCP visit and was measured about 10 x 12 cm along the right upper posterior shoulder.  She states this has been present for many years and has very slowly increased in size.  Recently it has become tender and uncomfortable.     Review of Systems: A complete review of systems was obtained from the patient.  I have reviewed this information and discussed as appropriate with the patient.  See HPI as well for other ROS.   Medical History: Past Medical History:  Diagnosis Date   Arthritis    Chronic kidney disease    Diabetes mellitus without complication (CMS/HHS-HCC)    History of cancer    Hypertension    Thyroid disease     There is no problem list on file for this patient.   Past Surgical History:  Procedure Laterality Date   AJCC BREAST CANCER STAGE I: T1MIC, T1A OR T1B (TUMOR SIZE < 1 CM) DOCUMENTED (ONC)     JOINT REPLACEMENT     LOBECTOMY PARTIAL THYROID       Allergies  Allergen Reactions   Tramadol Shortness Of Breath   Aliskiren Muscle Pain    Foot pain    Amlodipine Other (See Comments)   Clonidine Swelling   Codeine Other (See Comments)    "makes me feel funny"   Diclofenac Other (See Comments)   Diltiazem Swelling   Indapamide Other (See Comments)   Lisinopril Cough   Losartan Cough   Metoprolol Swelling   Spironolactone Nausea   Verapamil Headache    Current Outpatient Medications on File Prior to Visit  Medication Sig Dispense Refill   acetaminophen (TYLENOL) 500 MG tablet Take  by mouth     atorvastatin (LIPITOR) 10 MG tablet Take 10 mg by mouth once daily     fexofenadine (ALLEGRA) 180 MG tablet Take by mouth     hydrALAZINE (APRESOLINE) 25 MG tablet Take 25 mg by mouth 3 (three) times daily     levothyroxine (SYNTHROID) 50 MCG tablet TAKE 1 TABLET IN THE MORNING ON AN EMPTY STOMACH BY MOUTH ONCE A DAY     metFORMIN (GLUCOPHAGE) 500 MG tablet 2 TABLETS WITH FOOD BY MOUTH TWICE A DAY 90 DAY(S)     nebivoloL (BYSTOLIC) 10 MG tablet Take 10 mg by mouth once daily     telmisartan (MICARDIS) 80 MG tablet Take 80 mg by mouth once daily     vitamin E 400 UNIT capsule Take 400 Units by mouth once daily     No current facility-administered medications on file prior to visit.    Family History  Problem Relation Age of Onset   Stroke Mother    High blood pressure (Hypertension) Mother    Coronary Artery Disease (Blocked arteries around heart) Father    Stroke Brother    High blood pressure (Hypertension) Brother    Coronary Artery Disease (Blocked arteries around heart) Brother    Diabetes Brother      Social History   Tobacco Use  Smoking Status Former   Types: Cigarettes  Smokeless Tobacco Never  Social History   Socioeconomic History   Marital status: Married  Tobacco Use   Smoking status: Former    Types: Cigarettes   Smokeless tobacco: Never  Substance and Sexual Activity   Alcohol use: Not Currently   Drug use: Never    Objective:    Vitals:   11/14/22 1006  BP: (!) 164/81  Pulse: 77  Temp: 36.1 C (97 F)  SpO2: 97%  Weight: 73.9 kg (163 lb)  Height: 165.1 cm (5\' 5" )    Body mass index is 27.12 kg/m.  Gen: A&Ox3, no distress  Unlabored respirations Along the right posterior lateral shoulder is a smooth soft,, mobile subcutaneous mass approximately 10 x 12 cm without overlying skin change   Assessment and Plan:  Diagnoses and all orders for this visit:  Benign lipomatous neoplasm of skin and subcutaneous tissue of  trunk  We discussed excision and went over risks of bleeding, infection, pain, scarring, injury to underlying structures, seroma/hematoma, poor wound healing or undesired cosmetic result, recurrence of the lesion.  Questions welcomed and answered.  She wishes to proceed.  CHELSEA Carlye Grippe, MD

## 2023-01-03 NOTE — Anesthesia Postprocedure Evaluation (Signed)
Anesthesia Post Note  Patient: [name]  Procedure(s) Performed: EXCISION OF RIGHT SHOULDER MASS (Right: Shoulder)     Patient location during evaluation: PACU Anesthesia Type: General Level of consciousness: awake and alert Pain management: pain level controlled Vital Signs Assessment: post-procedure vital signs reviewed and stable Respiratory status: spontaneous breathing, nonlabored ventilation and respiratory function stable Cardiovascular status: blood pressure returned to baseline and stable Postop Assessment: no apparent nausea or vomiting Anesthetic complications: no   No notable events documented.  Last Vitals:  Vitals:   01/03/23 1053 01/03/23 1056  BP:    Pulse: 60 60  Resp: 16 12  Temp:  (!) 36.3 C  SpO2: 96% 95%    Last Pain:  Vitals:   01/03/23 1053  TempSrc:   PainSc: 0-No pain                 Lowella Curb

## 2023-01-03 NOTE — Discharge Instructions (Addendum)
GENERAL SURGERY: POST OP INSTRUCTIONS  EAT Gradually transition to a high fiber diet with a fiber supplement over the next few weeks after discharge.  Start with a pureed / full liquid diet (see below)  WALK Walk an hour a day (cumulative, not all at once).  Control your pain to do that.    CONTROL PAIN Control pain so that you can walk, sleep, tolerate sneezing/coughing, go up/down stairs.  HAVE A BOWEL MOVEMENT DAILY Keep your bowels regular to avoid problems.  OK to try a laxative to override constipation.  OK to use an antidairrheal to slow down diarrhea.  Call if not better after 2 tries  CALL IF YOU HAVE PROBLEMS/CONCERNS Call if you are still struggling despite following these instructions. Call if you have concerns not answered by these instructions    DIET: Follow a light bland diet & liquids the first 24 hours after arrival home, such as soup, liquids, starches, etc.  Be sure to drink plenty of fluids.  Quickly advance to a usual solid diet within a few days.  Avoid fast food or heavy meals as your are more likely to get nauseated or have irregular bowels.  A low-sugar, high-fiber diet for the rest of your life is ideal.   Take your usually prescribed home medications unless otherwise directed. PAIN CONTROL: Pain is best controlled by a usual combination of three different methods TOGETHER: Ice/Heat Over the counter pain medication Prescription pain medication Most patients will experience some swelling and bruising around the incisions.  Ice packs or heating pads (30-60 minutes up to 6 times a day) will help. Use ice for the first few days to help decrease swelling and bruising, then switch to heat to help relax tight/sore spots and speed recovery.  Some people prefer to use ice alone, heat alone, alternating between ice & heat.  Experiment to what works for you.  Swelling and bruising can take several weeks to resolve.   It is helpful to take an over-the-counter pain  medication regularly for the first few weeks.  Choose one of the following that works best for you: Naproxen (Aleve, etc)  Two 220mg  tabs twice a day OR Ibuprofen (Advil, etc) Three 200mg  tabs four times a day (every meal & bedtime) AND Acetaminophen (Tylenol, etc) 500-650mg  four times a day (every meal & bedtime) A  prescription for pain medication (such as oxycodone, hydrocodone, etc) should be given to you upon discharge.  Take your pain medication as prescribed.  If you are having problems/concerns with the prescription medicine (does not control pain, nausea, vomiting, rash, itching, etc), please call us [phone] to see if we need to switch you to a different pain medicine that will work better for you and/or control your side effect better. If you need a refill on your pain medication, please contact your pharmacy.  They will contact our office to request authorization. Prescriptions will not be filled after 5 pm or on week-ends. Avoid getting constipated.  Between the surgery and the pain medications, it is common to experience some constipation.  Increasing fluid intake and taking a fiber supplement (such as Metamucil, Citrucel, FiberCon, MiraLax, etc) 1-2 times a day regularly will usually help prevent this problem from occurring.  A mild laxative (prune juice, Milk of Magnesia, MiraLax, etc) should be taken according to package directions if there are no bowel movements after 48 hours.   Wash / shower every day, starting 2 days after surgery.  You may shower over the  steri strips as they are waterproof.  Continue to shower over incision(s) after the dressing is off. Remove your outer bandage 2 days after surgery; steri strips will peel off after 1-2 weeks.  You may leave the incision open to air.  You may have skin tapes (Steri Strips) covering the incision(s).  Leave them on until one week, then remove.  You may replace a dressing/Band-Aid to cover the incision for comfort if you wish.       ACTIVITIES as tolerated:   You may resume regular (light) daily activities beginning the next day--such as daily self-care, walking, climbing stairs--gradually increasing activities as tolerated.  If you can walk 30 minutes without difficulty, it is safe to try more intense activity such as jogging, treadmill, bicycling, low-impact aerobics, swimming, etc. Save the most intensive and strenuous activity for last such as sit-ups, heavy lifting, contact sports, etc  Refrain from any heavy lifting or straining until you are off narcotics for pain control.   DO NOT PUSH THROUGH PAIN.  Let pain be your guide: If it hurts to do something, don't do it.  Pain is your body warning you to avoid that activity for another week until the pain goes down. You may drive when you are no longer taking prescription pain medication, you can comfortably wear a seatbelt, and you can safely maneuver your car and apply brakes. You may have sexual intercourse when it is comfortable.  FOLLOW UP in our office Please call [facility] at [phone] to set up an appointment to see your surgeon in the office for a follow-up appointment approximately 2-3 weeks after your surgery. Make sure that you call for this appointment the day you arrive home to insure a convenient appointment time. 9. IF YOU HAVE DISABILITY OR FAMILY LEAVE FORMS, BRING THEM TO THE OFFICE FOR PROCESSING.  DO NOT GIVE THEM TO YOUR DOCTOR.   WHEN TO CALL us [phone]: Poor pain control Reactions / problems with new medications (rash/itching, nausea, etc)  Fever over 101.5 F (38.5 C) Worsening swelling or bruising Continued bleeding from incision. Increased pain, redness, or drainage from the incision Difficulty breathing / swallowing   The clinic staff is available to answer your questions during regular business hours (8:30am-5pm).  Please don't hesitate to call and ask to speak to one of our nurses for clinical concerns.   If you have a medical  emergency, go to the nearest emergency room or call 911.  A surgeon from [facility] is always on call at the [facility], [place] [address], [place], [place]  [address] ? MAIN: [phone]) [phone] ? TOLL FREE: [phone] ?  FAX [phone] [email or web address]  Post Anesthesia Home Care Instructions  Activity: Get plenty of rest for the remainder of the day. A responsible adult should stay with you for 24 hours following the procedure.  For the next 24 hours, DO NOT: -Drive a car -Advertising copywriter -Drink alcoholic beverages -Take any medication unless instructed by your physician -Make any legal decisions or sign important papers.  Meals: Start with liquid foods such as gelatin or soup. Progress to regular foods as tolerated. Avoid greasy, spicy, heavy foods. If nausea and/or vomiting occur, drink only clear liquids until the nausea and/or vomiting subsides. Call your physician if vomiting continues.  Special Instructions/Symptoms: Your throat may feel dry or sore from the anesthesia or the breathing tube placed in your throat during surgery. If this causes discomfort, gargle  with warm salt water. The discomfort should disappear within 24 hours.

## 2023-01-06 ENCOUNTER — Encounter (HOSPITAL_BASED_OUTPATIENT_CLINIC_OR_DEPARTMENT_OTHER): Payer: Self-pay | Admitting: Surgery

## 2023-01-10 DIAGNOSIS — H34832 Tributary (branch) retinal vein occlusion, left eye, with macular edema: Secondary | ICD-10-CM | POA: Diagnosis not present

## 2023-01-29 NOTE — Progress Notes (Signed)
   [name] [id number]  DATE OF ENCOUNTER: 01/29/2023 Interval History:   She has recovered uneventfully.  Still a little bit sore but no significant issues postop.  Physical Examination:   There were no vitals filed for this visit.  Alert, well-appearing Unlabored respirations Incision healing well.  No seroma, hematoma, or significant edema present.  Path:  FINAL MICROSCOPIC DIAGNOSIS:   A. SHOULDER MASS, RIGHT, EXCISION:  Changes consistent with lipoma   Assessment and Plan:   Recovering appropriately.   Discussed expectations for ongoing recovery, activity limitations if applicable, and reasons to call.  Follow-up as needed.  01/03/23 Procedure performed: excision of subcutaneous/ intramuscular 10x8x4cm mass, right shoulder with intermediate layered closure   Preop diagnosis: Subcutaneous mass Post-op diagnosis/intraop findings: Intramuscular lipoma  CHELSEA ALAN FREUND, MD

## 2023-02-04 DIAGNOSIS — E871 Hypo-osmolality and hyponatremia: Secondary | ICD-10-CM | POA: Diagnosis not present

## 2023-02-04 DIAGNOSIS — D631 Anemia in chronic kidney disease: Secondary | ICD-10-CM | POA: Diagnosis not present

## 2023-02-04 DIAGNOSIS — N182 Chronic kidney disease, stage 2 (mild): Secondary | ICD-10-CM | POA: Diagnosis not present

## 2023-02-04 DIAGNOSIS — I129 Hypertensive chronic kidney disease with stage 1 through stage 4 chronic kidney disease, or unspecified chronic kidney disease: Secondary | ICD-10-CM | POA: Diagnosis not present

## 2023-02-04 DIAGNOSIS — N2581 Secondary hyperparathyroidism of renal origin: Secondary | ICD-10-CM | POA: Diagnosis not present

## 2023-02-12 DIAGNOSIS — H43811 Vitreous degeneration, right eye: Secondary | ICD-10-CM | POA: Diagnosis not present

## 2023-02-12 DIAGNOSIS — H35361 Drusen (degenerative) of macula, right eye: Secondary | ICD-10-CM | POA: Diagnosis not present

## 2023-02-12 DIAGNOSIS — H34832 Tributary (branch) retinal vein occlusion, left eye, with macular edema: Secondary | ICD-10-CM | POA: Diagnosis not present

## 2023-02-12 DIAGNOSIS — E113293 Type 2 diabetes mellitus with mild nonproliferative diabetic retinopathy without macular edema, bilateral: Secondary | ICD-10-CM | POA: Diagnosis not present

## 2023-03-20 DIAGNOSIS — C50911 Malignant neoplasm of unspecified site of right female breast: Secondary | ICD-10-CM | POA: Diagnosis not present

## 2023-03-22 DIAGNOSIS — Z23 Encounter for immunization: Secondary | ICD-10-CM | POA: Diagnosis not present

## 2023-03-25 DIAGNOSIS — C50911 Malignant neoplasm of unspecified site of right female breast: Secondary | ICD-10-CM | POA: Diagnosis not present

## 2023-04-17 DIAGNOSIS — E113391 Type 2 diabetes mellitus with moderate nonproliferative diabetic retinopathy without macular edema, right eye: Secondary | ICD-10-CM | POA: Diagnosis not present

## 2023-04-17 DIAGNOSIS — I700 Atherosclerosis of aorta: Secondary | ICD-10-CM | POA: Diagnosis not present

## 2023-04-17 DIAGNOSIS — E039 Hypothyroidism, unspecified: Secondary | ICD-10-CM | POA: Diagnosis not present

## 2023-04-17 DIAGNOSIS — E113292 Type 2 diabetes mellitus with mild nonproliferative diabetic retinopathy without macular edema, left eye: Secondary | ICD-10-CM | POA: Diagnosis not present

## 2023-04-17 DIAGNOSIS — I10 Essential (primary) hypertension: Secondary | ICD-10-CM | POA: Diagnosis not present

## 2023-04-17 DIAGNOSIS — E871 Hypo-osmolality and hyponatremia: Secondary | ICD-10-CM | POA: Diagnosis not present

## 2023-04-17 DIAGNOSIS — E1122 Type 2 diabetes mellitus with diabetic chronic kidney disease: Secondary | ICD-10-CM | POA: Diagnosis not present

## 2023-04-17 DIAGNOSIS — N2581 Secondary hyperparathyroidism of renal origin: Secondary | ICD-10-CM | POA: Diagnosis not present

## 2023-04-17 DIAGNOSIS — Z0000 Encounter for general adult medical examination without abnormal findings: Secondary | ICD-10-CM | POA: Diagnosis not present

## 2023-05-13 DIAGNOSIS — H43811 Vitreous degeneration, right eye: Secondary | ICD-10-CM | POA: Diagnosis not present

## 2023-05-13 DIAGNOSIS — E113293 Type 2 diabetes mellitus with mild nonproliferative diabetic retinopathy without macular edema, bilateral: Secondary | ICD-10-CM | POA: Diagnosis not present

## 2023-05-13 DIAGNOSIS — H34832 Tributary (branch) retinal vein occlusion, left eye, with macular edema: Secondary | ICD-10-CM | POA: Diagnosis not present

## 2023-05-13 DIAGNOSIS — H35361 Drusen (degenerative) of macula, right eye: Secondary | ICD-10-CM | POA: Diagnosis not present

## 2023-08-01 ENCOUNTER — Other Ambulatory Visit: Payer: Self-pay | Admitting: Family Medicine

## 2023-08-01 DIAGNOSIS — Z1231 Encounter for screening mammogram for malignant neoplasm of breast: Secondary | ICD-10-CM

## 2023-09-11 ENCOUNTER — Ambulatory Visit
Admission: RE | Admit: 2023-09-11 | Discharge: 2023-09-11 | Disposition: A | Discharge status: Home / Self Care | Payer: Medicare HMO | Source: Ambulatory Visit | Attending: Family Medicine | Admitting: Family Medicine

## 2023-09-11 DIAGNOSIS — Z1231 Encounter for screening mammogram for malignant neoplasm of breast: Secondary | ICD-10-CM

## 2024-01-14 DIAGNOSIS — M79671 Pain in right foot: Secondary | ICD-10-CM | POA: Diagnosis not present

## 2024-01-19 DIAGNOSIS — R300 Dysuria: Secondary | ICD-10-CM | POA: Diagnosis not present

## 2024-01-30 DIAGNOSIS — H26492 Other secondary cataract, left eye: Secondary | ICD-10-CM | POA: Diagnosis not present

## 2024-01-30 DIAGNOSIS — H18413 Arcus senilis, bilateral: Secondary | ICD-10-CM | POA: Diagnosis not present

## 2024-01-30 DIAGNOSIS — H34832 Tributary (branch) retinal vein occlusion, left eye, with macular edema: Secondary | ICD-10-CM | POA: Diagnosis not present

## 2024-01-30 DIAGNOSIS — H2511 Age-related nuclear cataract, right eye: Secondary | ICD-10-CM | POA: Diagnosis not present

## 2024-02-02 DIAGNOSIS — N182 Chronic kidney disease, stage 2 (mild): Secondary | ICD-10-CM | POA: Diagnosis not present

## 2024-02-08 DIAGNOSIS — E113391 Type 2 diabetes mellitus with moderate nonproliferative diabetic retinopathy without macular edema, right eye: Secondary | ICD-10-CM | POA: Diagnosis not present

## 2024-02-08 DIAGNOSIS — E1122 Type 2 diabetes mellitus with diabetic chronic kidney disease: Secondary | ICD-10-CM | POA: Diagnosis not present

## 2024-02-08 DIAGNOSIS — E113293 Type 2 diabetes mellitus with mild nonproliferative diabetic retinopathy without macular edema, bilateral: Secondary | ICD-10-CM | POA: Diagnosis not present

## 2024-02-08 DIAGNOSIS — E039 Hypothyroidism, unspecified: Secondary | ICD-10-CM | POA: Diagnosis not present

## 2024-02-11 DIAGNOSIS — M79671 Pain in right foot: Secondary | ICD-10-CM | POA: Diagnosis not present

## 2024-02-16 DIAGNOSIS — N182 Chronic kidney disease, stage 2 (mild): Secondary | ICD-10-CM | POA: Diagnosis not present

## 2024-02-16 DIAGNOSIS — D631 Anemia in chronic kidney disease: Secondary | ICD-10-CM | POA: Diagnosis not present

## 2024-02-16 DIAGNOSIS — R609 Edema, unspecified: Secondary | ICD-10-CM | POA: Diagnosis not present

## 2024-02-16 DIAGNOSIS — I129 Hypertensive chronic kidney disease with stage 1 through stage 4 chronic kidney disease, or unspecified chronic kidney disease: Secondary | ICD-10-CM | POA: Diagnosis not present

## 2024-02-16 DIAGNOSIS — E1122 Type 2 diabetes mellitus with diabetic chronic kidney disease: Secondary | ICD-10-CM | POA: Diagnosis not present

## 2024-02-16 DIAGNOSIS — E871 Hypo-osmolality and hyponatremia: Secondary | ICD-10-CM | POA: Diagnosis not present

## 2024-02-16 DIAGNOSIS — N2581 Secondary hyperparathyroidism of renal origin: Secondary | ICD-10-CM | POA: Diagnosis not present

## 2024-02-18 DIAGNOSIS — H52223 Regular astigmatism, bilateral: Secondary | ICD-10-CM | POA: Diagnosis not present

## 2024-02-23 DIAGNOSIS — H43811 Vitreous degeneration, right eye: Secondary | ICD-10-CM | POA: Diagnosis not present

## 2024-02-23 DIAGNOSIS — H35361 Drusen (degenerative) of macula, right eye: Secondary | ICD-10-CM | POA: Diagnosis not present

## 2024-02-23 DIAGNOSIS — E113391 Type 2 diabetes mellitus with moderate nonproliferative diabetic retinopathy without macular edema, right eye: Secondary | ICD-10-CM | POA: Diagnosis not present

## 2024-02-23 DIAGNOSIS — H34832 Tributary (branch) retinal vein occlusion, left eye, with macular edema: Secondary | ICD-10-CM | POA: Diagnosis not present

## 2024-02-23 DIAGNOSIS — E113292 Type 2 diabetes mellitus with mild nonproliferative diabetic retinopathy without macular edema, left eye: Secondary | ICD-10-CM | POA: Diagnosis not present

## 2024-03-02 DIAGNOSIS — H34832 Tributary (branch) retinal vein occlusion, left eye, with macular edema: Secondary | ICD-10-CM | POA: Diagnosis not present

## 2024-03-02 DIAGNOSIS — B085 Enteroviral vesicular pharyngitis: Secondary | ICD-10-CM | POA: Diagnosis not present

## 2024-03-09 DIAGNOSIS — E113391 Type 2 diabetes mellitus with moderate nonproliferative diabetic retinopathy without macular edema, right eye: Secondary | ICD-10-CM | POA: Diagnosis not present

## 2024-03-09 DIAGNOSIS — Z23 Encounter for immunization: Secondary | ICD-10-CM | POA: Diagnosis not present

## 2024-03-09 DIAGNOSIS — R42 Dizziness and giddiness: Secondary | ICD-10-CM | POA: Diagnosis not present

## 2024-03-09 DIAGNOSIS — E039 Hypothyroidism, unspecified: Secondary | ICD-10-CM | POA: Diagnosis not present

## 2024-03-09 DIAGNOSIS — E1122 Type 2 diabetes mellitus with diabetic chronic kidney disease: Secondary | ICD-10-CM | POA: Diagnosis not present

## 2024-03-09 DIAGNOSIS — I10 Essential (primary) hypertension: Secondary | ICD-10-CM | POA: Diagnosis not present

## 2024-03-09 DIAGNOSIS — E113293 Type 2 diabetes mellitus with mild nonproliferative diabetic retinopathy without macular edema, bilateral: Secondary | ICD-10-CM | POA: Diagnosis not present

## 2024-03-15 DIAGNOSIS — N182 Chronic kidney disease, stage 2 (mild): Secondary | ICD-10-CM | POA: Diagnosis not present

## 2024-03-17 DIAGNOSIS — S92351D Displaced fracture of fifth metatarsal bone, right foot, subsequent encounter for fracture with routine healing: Secondary | ICD-10-CM | POA: Diagnosis not present

## 2024-03-22 DIAGNOSIS — M47816 Spondylosis without myelopathy or radiculopathy, lumbar region: Secondary | ICD-10-CM | POA: Diagnosis not present

## 2024-03-25 DIAGNOSIS — I10 Essential (primary) hypertension: Secondary | ICD-10-CM | POA: Diagnosis not present

## 2024-03-25 DIAGNOSIS — E113391 Type 2 diabetes mellitus with moderate nonproliferative diabetic retinopathy without macular edema, right eye: Secondary | ICD-10-CM | POA: Diagnosis not present

## 2024-03-25 DIAGNOSIS — I951 Orthostatic hypotension: Secondary | ICD-10-CM | POA: Diagnosis not present

## 2024-03-25 DIAGNOSIS — Z79899 Other long term (current) drug therapy: Secondary | ICD-10-CM | POA: Diagnosis not present

## 2024-03-25 DIAGNOSIS — R42 Dizziness and giddiness: Secondary | ICD-10-CM | POA: Diagnosis not present

## 2024-03-30 ENCOUNTER — Emergency Department (HOSPITAL_COMMUNITY)

## 2024-03-30 ENCOUNTER — Observation Stay (HOSPITAL_COMMUNITY)
Admission: EM | Admit: 2024-03-30 | Discharge: 2024-04-01 | Disposition: A | Discharge status: Home / Self Care | Attending: Family Medicine | Admitting: Family Medicine

## 2024-03-30 ENCOUNTER — Other Ambulatory Visit: Payer: Self-pay

## 2024-03-30 DIAGNOSIS — Z7982 Long term (current) use of aspirin: Secondary | ICD-10-CM | POA: Insufficient documentation

## 2024-03-30 DIAGNOSIS — E871 Hypo-osmolality and hyponatremia: Secondary | ICD-10-CM | POA: Insufficient documentation

## 2024-03-30 DIAGNOSIS — Z853 Personal history of malignant neoplasm of breast: Secondary | ICD-10-CM | POA: Insufficient documentation

## 2024-03-30 DIAGNOSIS — R112 Nausea with vomiting, unspecified: Secondary | ICD-10-CM | POA: Diagnosis not present

## 2024-03-30 DIAGNOSIS — R55 Syncope and collapse: Principal | ICD-10-CM

## 2024-03-30 DIAGNOSIS — R1111 Vomiting without nausea: Secondary | ICD-10-CM | POA: Diagnosis not present

## 2024-03-30 DIAGNOSIS — I129 Hypertensive chronic kidney disease with stage 1 through stage 4 chronic kidney disease, or unspecified chronic kidney disease: Secondary | ICD-10-CM | POA: Insufficient documentation

## 2024-03-30 DIAGNOSIS — E039 Hypothyroidism, unspecified: Secondary | ICD-10-CM | POA: Insufficient documentation

## 2024-03-30 DIAGNOSIS — G459 Transient cerebral ischemic attack, unspecified: Secondary | ICD-10-CM | POA: Diagnosis not present

## 2024-03-30 DIAGNOSIS — N181 Chronic kidney disease, stage 1: Secondary | ICD-10-CM | POA: Diagnosis not present

## 2024-03-30 DIAGNOSIS — E1122 Type 2 diabetes mellitus with diabetic chronic kidney disease: Secondary | ICD-10-CM | POA: Insufficient documentation

## 2024-03-30 DIAGNOSIS — R000 Tachycardia, unspecified: Secondary | ICD-10-CM | POA: Diagnosis not present

## 2024-03-30 DIAGNOSIS — E119 Type 2 diabetes mellitus without complications: Secondary | ICD-10-CM

## 2024-03-30 DIAGNOSIS — Z7984 Long term (current) use of oral hypoglycemic drugs: Secondary | ICD-10-CM | POA: Insufficient documentation

## 2024-03-30 DIAGNOSIS — I951 Orthostatic hypotension: Secondary | ICD-10-CM | POA: Diagnosis not present

## 2024-03-30 DIAGNOSIS — R61 Generalized hyperhidrosis: Secondary | ICD-10-CM | POA: Diagnosis not present

## 2024-03-30 DIAGNOSIS — Z79899 Other long term (current) drug therapy: Secondary | ICD-10-CM | POA: Diagnosis not present

## 2024-03-30 DIAGNOSIS — R42 Dizziness and giddiness: Secondary | ICD-10-CM | POA: Diagnosis not present

## 2024-03-30 DIAGNOSIS — R0602 Shortness of breath: Secondary | ICD-10-CM | POA: Insufficient documentation

## 2024-03-30 DIAGNOSIS — I10 Essential (primary) hypertension: Secondary | ICD-10-CM | POA: Diagnosis present

## 2024-03-30 DIAGNOSIS — R110 Nausea: Secondary | ICD-10-CM | POA: Diagnosis not present

## 2024-03-30 DIAGNOSIS — N183 Chronic kidney disease, stage 3 unspecified: Secondary | ICD-10-CM | POA: Insufficient documentation

## 2024-03-30 DIAGNOSIS — R29818 Other symptoms and signs involving the nervous system: Secondary | ICD-10-CM | POA: Diagnosis not present

## 2024-03-30 DIAGNOSIS — D649 Anemia, unspecified: Secondary | ICD-10-CM | POA: Insufficient documentation

## 2024-03-30 LAB — URINALYSIS, ROUTINE W REFLEX MICROSCOPIC
Bilirubin Urine: NEGATIVE
Glucose, UA: NEGATIVE mg/dL
Hgb urine dipstick: NEGATIVE
Ketones, ur: NEGATIVE mg/dL
Nitrite: NEGATIVE
Protein, ur: NEGATIVE mg/dL
Specific Gravity, Urine: 1.009 (ref 1.005–1.030)
pH: 5 (ref 5.0–8.0)

## 2024-03-30 LAB — COMPREHENSIVE METABOLIC PANEL WITH GFR
ALT: 10 U/L (ref 0–44)
AST: 24 U/L (ref 15–41)
Albumin: 3.5 g/dL (ref 3.5–5.0)
Alkaline Phosphatase: 33 U/L — ABNORMAL LOW (ref 38–126)
Anion gap: 13 (ref 5–15)
BUN: 13 mg/dL (ref 8–23)
CO2: 20 mmol/L — ABNORMAL LOW (ref 22–32)
Calcium: 8.6 mg/dL — ABNORMAL LOW (ref 8.9–10.3)
Chloride: 96 mmol/L — ABNORMAL LOW (ref 98–111)
Creatinine, Ser: 1.18 mg/dL — ABNORMAL HIGH (ref 0.44–1.00)
GFR, Estimated: 47 mL/min — ABNORMAL LOW (ref >60)
Glucose, Bld: 126 mg/dL — ABNORMAL HIGH (ref 70–99)
Potassium: 4.6 mmol/L (ref 3.5–5.1)
Sodium: 129 mmol/L — ABNORMAL LOW (ref 135–145)
Total Bilirubin: 0.9 mg/dL (ref 0.0–1.2)
Total Protein: 6.3 g/dL — ABNORMAL LOW (ref 6.5–8.1)

## 2024-03-30 LAB — I-STAT CHEM 8, ED
BUN: 14 mg/dL (ref 8–23)
Calcium, Ion: 1.05 mmol/L — ABNORMAL LOW (ref 1.15–1.40)
Chloride: 92 mmol/L — ABNORMAL LOW (ref 98–111)
Creatinine, Ser: 1.1 mg/dL — ABNORMAL HIGH (ref 0.44–1.00)
Glucose, Bld: 97 mg/dL (ref 70–99)
HCT: 29 % — ABNORMAL LOW (ref 36.0–46.0)
Hemoglobin: 9.9 g/dL — ABNORMAL LOW (ref 12.0–15.0)
Potassium: 4.9 mmol/L (ref 3.5–5.1)
Sodium: 128 mmol/L — ABNORMAL LOW (ref 135–145)
TCO2: 24 mmol/L (ref 22–32)

## 2024-03-30 LAB — CBC
HCT: 29 % — ABNORMAL LOW (ref 36.0–46.0)
Hemoglobin: 9.5 g/dL — ABNORMAL LOW (ref 12.0–15.0)
MCH: 30.6 pg (ref 26.0–34.0)
MCHC: 32.8 g/dL (ref 30.0–36.0)
MCV: 93.5 fL (ref 80.0–100.0)
Platelets: 193 K/uL (ref 150–400)
RBC: 3.1 MIL/uL — ABNORMAL LOW (ref 3.87–5.11)
RDW: 14.1 % (ref 11.5–15.5)
WBC: 3.5 K/uL — ABNORMAL LOW (ref 4.0–10.5)
nRBC: 0 % (ref 0.0–0.2)

## 2024-03-30 LAB — TROPONIN I (HIGH SENSITIVITY)
Troponin I (High Sensitivity): 12 ng/L (ref <18)
Troponin I (High Sensitivity): 16 ng/L (ref <18)

## 2024-03-30 LAB — BRAIN NATRIURETIC PEPTIDE: B Natriuretic Peptide: 34 pg/mL (ref 0.0–100.0)

## 2024-03-30 LAB — LIPASE, BLOOD: Lipase: 17 U/L (ref 11–51)

## 2024-03-30 LAB — POC OCCULT BLOOD, ED: Fecal Occult Bld: NEGATIVE

## 2024-03-30 MED ORDER — LORAZEPAM 1 MG PO TABS: 0.5000 mg | ORAL_TABLET | Freq: Once | ORAL | Status: DC

## 2024-03-30 MED ORDER — LORAZEPAM 2 MG/ML IJ SOLN
0.5000 mg | Freq: Once | INTRAMUSCULAR | Status: AC
  Administered 2024-03-30: 0.5 mg via INTRAVENOUS
  Filled 2024-03-30: qty 1

## 2024-03-30 MED ORDER — SODIUM CHLORIDE 0.9 % IV BOLUS
1000.0000 mL | Freq: Once | INTRAVENOUS | Status: AC
  Administered 2024-03-30: 1000 mL via INTRAVENOUS

## 2024-03-30 MED ORDER — LORAZEPAM 1 MG PO TABS: 0.5000 mg | ORAL_TABLET | Freq: Once | ORAL | Status: DC | PRN

## 2024-03-30 MED ORDER — SODIUM CHLORIDE 0.9 % IV BOLUS
500.0000 mL | Freq: Once | INTRAVENOUS | Status: DC
  Administered 2024-03-30: 500 mL via INTRAVENOUS

## 2024-03-30 MED ORDER — IOHEXOL 350 MG/ML SOLN
75.0000 mL | Freq: Once | INTRAVENOUS | Status: AC | PRN
Start: 2024-03-30 — End: 2024-03-30
  Administered 2024-03-30: 75 mL via INTRAVENOUS

## 2024-03-30 NOTE — ED Triage Notes (Signed)
 [place] coming with patient from  gym. Patient was riding her bike/spinner at the gym - Sudden onset nausea/dizziness/and passed out as per witnesses. Copious amounts of vomit. When EMS arrived she was diaphoretic and slumped over, slow to respond, but AxOx4. Hypotensive initially. Pt denies pain.   When EMS arrived the patient complained of R leg weakness and numbess, but EMS states this could from been how the patient was sitting in the chair when they found her. Has now resolved.   Routinely goes to gym MWF     EMS vitals& Interventions: IV: 20G LAC 4mg  Zofran  given with no relief  NS   BP 150/80 HR 62 O2 99 on RA RR 18 CBG 196

## 2024-03-30 NOTE — H&P (Signed)
 History and Physical    [name] FMW:[id number] DOB: 1946-02-18 DOA: 03/30/2024  Patient coming from: Home.  Chief Complaint: Loss of consciousness.  HPI: [name] is a 78 y.o. female with history of hypertension, diabetes mellitus type 2, hyperlipidemia, hypothyroidism prior history of breast cancer status postlumpectomy chemo and radiation was brought to the ER after patient had a syncopal episode.  Patient states that about a month ago patient was placed on Lasix for 2 weeks by patient's nephrologist.  And last week when patient had followed up with her primary care physician patient was found to have low blood pressure and her antihypertensives were discontinued.  Today while she was riding on her bike/spinner at the gym patient suddenly had an onset of nausea vomiting dizziness and passed out as per the witness.  Patient had copious amount of vomiting.  Patient was appearing diaphoretic.  EMS arrived and patient was found to be hypotensive initially.  Denies any chest pain.  There was some concern that patient may be having right leg weakness but later felt it was due to the positioning.  ED Course: In the ER patient's blood pressure was elevated.  Heart rate was around 90/min.  MRI brain did not show anything acute.  CT angiogram head and neck also did not show any acute.  Labs show sodium 128 creatinine 1.1 hemoglobin 9.5 WBC 3.5.  Troponins were negative.  Stool for occult blood was negative.  Monitor showing sinus rhythm.  EKG shows normal sinus rhythm.  Patient was given 1 L fluid bolus.  Review of Systems: As per HPI, rest all negative.   Past Medical History:  Diagnosis Date   Chronic kidney disease stage 1    Complication of anesthesia    Diabetes mellitus without complication (HCC)    Hypertension    Hypothyroidism    Lipoma of right  shoulder    Personal history of chemotherapy 1999   Personal history of radiation therapy 2000   PONV (postoperative nausea  and vomiting)    right breast Cancer (HCC) 1999    Past Surgical History:  Procedure Laterality Date   BREAST BIOPSY Left 2015   BREAST BIOPSY Right 2009   BREAST LUMPECTOMY Right 1999   EYE SURGERY Left    cataract lens replacement   KNEE ARTHROSCOPY Bilateral    LIPOMA EXCISION Right 01/03/2023   Procedure: EXCISION OF RIGHT SHOULDER MASS;  Surgeon: Signe Mitzie LABOR, MD;  Location: [facility];  Service: General;  Laterality: Right;   partial thyroid  lobectomy     yrs ago age 66's     reports that she has quit smoking. She has never used smokeless tobacco. She reports that she does not currently use alcohol. She reports that she does not use drugs.  Allergies  Allergen Reactions   Aliskiren     Muscle and foot pain   Clonidine Derivatives     swelling   Codeine     makes me feel funny    Diclofenac    Diltiazem     swelling   Lisinopril Cough   Losartan Cough   Meloxicam      Headache    Metoprolol Swelling   Spironolactone Nausea Only   Tramadol     sob   Verapamil     headache    No family history on file.  Prior to Admission medications   Medication Sig Start Date End Date Taking? Authorizing Provider  acetaminophen  (TYLENOL ) 500 MG tablet Take  500 mg by mouth every 6 (six) hours as needed for moderate pain or headache.    [provider]  Alcohol Swabs (B-D SINGLE USE SWABS REGULAR) PADS  08/29/16   [provider]  aspirin  EC 81 MG tablet Take 81 mg by mouth daily. Swallow whole.    [provider]  atorvastatin  (LIPITOR) 10 MG tablet Take 10 mg by mouth every evening.    [provider]  CALCIUM  PO Take by mouth. 2 tabs daily    [provider]  fexofenadine (ALLEGRA) 180 MG tablet Take 180 mg by mouth daily.    [provider]  hydrALAZINE (APRESOLINE) 25 MG tablet Take 25 mg by mouth 2 (two) times daily.    [provider]  levothyroxine  (SYNTHROID , LEVOTHROID) 50 MCG tablet  Take 50 mcg by mouth daily before breakfast.  09/14/16   [provider]  metFORMIN  (GLUCOPHAGE ) 500 MG tablet Take 500 mg by mouth 2 (two) times daily with a meal. 08/03/16   [provider]  nebivolol (BYSTOLIC) 10 MG tablet Take 10 mg by mouth daily.    [provider]  OVER THE COUNTER MEDICATION Apply 1 inch topically daily. Aspircream arthritis    [provider]  telmisartan (MICARDIS) 80 MG tablet Take 80 mg by mouth every evening.    [provider]  vitamin E (VITAMIN E) 400 UNIT capsule Take 400 Units by mouth daily.    [provider]    Physical Exam: Constitutional: Moderately built and nourished. Vitals:   03/30/24 2100 03/30/24 2109 03/30/24 2145 03/30/24 2315  BP: (!) 157/78  (!) 167/88 (!) 153/73  Pulse: 79  91 78  Resp:    20  Temp:  98.1 F (36.7 C)    TempSrc:  Oral    SpO2: 100%  100% 100%  Weight:      Height:       Eyes: Anicteric no pallor. ENMT: No discharge from the ears eyes nose or mouth. Neck: No mass felt.  No neck rigidity. Respiratory: No rhonchi or crepitations. Cardiovascular: S1-S2 heard. Abdomen: Soft nontender bowel sound present. Musculoskeletal: No edema. Skin: No rash. Neurologic: Alert awake oriented time place and person.  Moves all extremities. Psychiatric: Appears normal.  Normal affect.   Labs on Admission: I have personally reviewed following labs and imaging studies  CBC: Recent Labs  Lab 03/30/24 1745 03/30/24 1833  WBC 3.5*  --   HGB 9.5* 9.9*  HCT 29.0* 29.0*  MCV 93.5  --   PLT 193  --    Basic Metabolic Panel: Recent Labs  Lab 03/30/24 1534 03/30/24 1833  NA 129* 128*  K 4.6 4.9  CL 96* 92*  CO2 20*  --   GLUCOSE 126* 97  BUN 13 14  CREATININE 1.18* 1.10*  CALCIUM  8.6*  --    GFR: Estimated Creatinine Clearance: 37.9 mL/min (A) (by C-G formula based on SCr of 1.1 mg/dL (H)). Liver Function Tests: Recent Labs  Lab 03/30/24 1534  AST 24  ALT 10   ALKPHOS 33*  BILITOT 0.9  PROT 6.3*  ALBUMIN 3.5   Recent Labs  Lab 03/30/24 1534  LIPASE 17   No results for input(s): AMMONIA in the last 168 hours. Coagulation Profile: No results for input(s): INR, PROTIME in the last 168 hours. Cardiac Enzymes: No results for input(s): CKTOTAL, CKMB, CKMBINDEX, TROPONINI in the last 168 hours. BNP (last 3 results) No results for input(s): PROBNP in the last 8760 hours.  HbA1C: No results for input(s): HGBA1C in the last 72 hours. CBG: No results for input(s): GLUCAP in the last 168 hours. Lipid Profile: No results for input(s): CHOL, HDL, LDLCALC, TRIG, CHOLHDL, LDLDIRECT in the last 72 hours. Thyroid  Function Tests: No results for input(s): TSH, T4TOTAL, FREET4, T3FREE, THYROIDAB in the last 72 hours. Anemia Panel: No results for input(s): VITAMINB12, FOLATE, FERRITIN, TIBC, IRON, RETICCTPCT in the last 72 hours. Urine analysis:    Component Value Date/Time   COLORURINE YELLOW 03/30/2024 1534   APPEARANCEUR CLEAR 03/30/2024 1534   LABSPEC 1.009 03/30/2024 1534   PHURINE 5.0 03/30/2024 1534   GLUCOSEU NEGATIVE 03/30/2024 1534   HGBUR NEGATIVE 03/30/2024 1534   BILIRUBINUR NEGATIVE 03/30/2024 1534   KETONESUR NEGATIVE 03/30/2024 1534   PROTEINUR NEGATIVE 03/30/2024 1534   NITRITE NEGATIVE 03/30/2024 1534   LEUKOCYTESUR SMALL (A) 03/30/2024 1534   Sepsis Labs: @LABRCNTIP (procalcitonin:4,lacticidven:4) )No results found for this or any previous visit (from the past 240 hours).   Radiological Exams on Admission: MR BRAIN WO CONTRAST Result Date: 03/30/2024 EXAM: MRI BRAIN WITHOUT CONTRAST 03/30/2024 08:11:24 PM TECHNIQUE: Multiplanar multisequence MRI of the head/brain was performed without the administration of intravenous contrast. COMPARISON: 07/30/2018 CLINICAL HISTORY: Acute neuro deficit, stroke suspected. Patient experienced sudden onset nausea, dizziness, and passed out  while at the gym, with associated copious vomiting. Upon EMS arrival, she was diaphoretic, slumped over, and slow to respond, but oriented x4. Initial hypotension. Patient denied pain. Right leg weakness and numbness were noted by EMS, which has since resolved. FINDINGS: BRAIN AND VENTRICLES: No acute infarct. No intracranial hemorrhage. No mass. No midline shift. No hydrocephalus. Multifocal hyperintense T2-weighted signal within the cerebral white matter, most commonly due to chronic small vessel disease. The sella is unremarkable. Normal flow voids. ORBITS: Left ocular lens replacement. SINUSES AND MASTOIDS: No acute abnormality. BONES AND SOFT TISSUES: Normal marrow signal. No acute soft tissue abnormality. IMPRESSION: 1. No acute intracranial abnormality. 2. Multifocal T2 hyperintense white matter signal, most consistent with chronic small vessel disease. Electronically signed by: Franky Stanford MD 03/30/2024 08:26 PM EDT RP Workstation: [id number]   CT ANGIO HEAD NECK W WO CM Result Date: 03/30/2024 EXAM: CTA HEAD AND NECK WITHOUT AND WITH 03/30/2024 06:19:00 PM TECHNIQUE: CTA of the head and neck was performed without and with the administration of 75 mL of intravenous contrast (iohexol  (OMNIPAQUE ) 350 MG/ML injection 75 mL IOHEXOL  350 MG/ML SOLN). Multiplanar 2D and/or 3D reformatted images are provided for review. Automated exposure control, iterative reconstruction, and/or weight based adjustment of the mA/kV was utilized to reduce the radiation dose to as low as reasonably achievable. Stenosis of the internal carotid arteries measured using NASCET criteria. COMPARISON: None available CLINICAL HISTORY: Transient ischemic attack (TIA). Syncope. Patient experienced sudden onset nausea, dizziness, and passed out while at the gym. Witnesses reported copious vomiting. Upon EMS arrival, the patient was diaphoretic, slumped over, slow to respond, but alert and oriented x4. Initially hypotensive. Patient denies  pain. EMS noted right leg weakness and numbness, which has since resolved. Patient routinely goes to the gym MWF. FINDINGS: CTA NECK: AORTIC ARCH AND ARCH VESSELS: No dissection or arterial injury. No significant stenosis of the brachiocephalic or subclavian arteries. CERVICAL CAROTID ARTERIES: No dissection, arterial injury, or hemodynamically significant stenosis by NASCET criteria. CERVICAL VERTEBRAL ARTERIES: No dissection, arterial injury, or significant stenosis. LUNGS AND MEDIASTINUM: Unremarkable. SOFT TISSUES: Status post left hemithyroidectomy. 6 mm nodule at the left thyroid  bed is nonspecific and could be a lymph node or  parathyroid tissue. BONES: No acute abnormality. CTA HEAD: ANTERIOR CIRCULATION: No significant stenosis of the internal carotid arteries. No significant stenosis of the anterior cerebral arteries. No significant stenosis of the middle cerebral arteries. No aneurysm. POSTERIOR CIRCULATION: No significant stenosis of the posterior cerebral arteries. No significant stenosis of the basilar artery. No significant stenosis of the vertebral arteries. No aneurysm. OTHER: No dural venous sinus thrombosis on this non-dedicated study. BRAIN: Chronic ischemic white matter changes. No mass, hemorrhage, or extra-axial collection. IMPRESSION: 1. No large vessel occlusion, hemodynamically significant stenosis, or aneurysm in the head or neck. 2. Chronic ischemic white matter changes without mass, hemorrhage, or extra-axial collection. Electronically signed by: Franky Stanford MD 03/30/2024 07:14 PM EDT RP Workstation: [id number]   DG Chest Portable 1 View Result Date: 03/30/2024 EXAM: 1 VIEW(S) XRAY OF THE CHEST 03/30/2024 04:18:00 PM COMPARISON: None available. CLINICAL HISTORY: syncope. Syncope. [place] coming with patient from gym. Patient was riding her bike/spinner at the gym - Sudden onset nausea/dizziness/and passed out as per witnesses. Copious amounts of vomit. When EMS arrived she was  diaphoretic and slumped over, slow to respond, but AxOx4. Hypotensive initially. Pt denies pain. When EMS arrived the patient complained of R leg weakness and numbness, but EMS states this could from been how the patient was sitting in the chair when they found her. Has now resolved. FINDINGS: LUNGS AND PLEURA: No focal pulmonary opacity. No pulmonary edema. No pleural effusion. No pneumothorax. HEART AND MEDIASTINUM: No acute abnormality of the cardiac and mediastinal silhouettes. BONES AND SOFT TISSUES: Moderate thoracic spondylosis. IMPRESSION: 1. No acute cardiopulmonary process. Electronically signed by: Norman Gatlin MD 03/30/2024 04:29 PM EDT RP Workstation: [id number]    EKG: Independently reviewed.  Normal sinus rhythm.  Assessment/Plan Principal Problem:   Syncope Active Problems:   Essential hypertension   Diabetes mellitus type 2, noninsulin dependent (HCC)   Anemia    Syncope -     happened after an episode of nausea vomiting may be vasovagal.  Patient also has had been having recently low normal blood pressure and antihypertensives were discontinued but in the ER blood pressure was in the higher levels.  Will closely monitor in telemetry.  Check 2D echo.  Check orthostatics in the morning.  Check cortisol levels due to patient's blood pressure dropping recently. Nausea vomiting with hyponatremia abdomen appears benign.  No further episodes.  Will closely monitor.  Follow sodium levels after hydration. Chronic kidney disease stage III creatinine at around the baseline when compared to July 2024.  Follows with nephrology.  Was recently placed on Lasix for 2 weeks. Anemia likely from renal disease.  Has decreased from July 2024 but stool for occult blood is negative.  Will closely monitor CBC check anemia panel. Hypertension recently patient's antihypertensives were discontinued by primary care physician last week due to low normal blood pressure. Hypothyroidism on Synthroid . Diabetes  mellitus type II takes metformin .  Presently on sliding scale coverage.  Check hemoglobin A1c. Hyperlipidemia on statins. Prior history of breast cancer.   DVT prophylaxis: Heparin. Code Status: Full code. Family Communication: Daughter at the bedside. Disposition Plan: Monitored bed. Consults called: None. Admission status: The patient.

## 2024-03-30 NOTE — ED Notes (Addendum)
 Attempted to collect blood from IV. IV is not giving return. NT Lateefa at bedside to collect EKG, orthostatics, and UA. Will attempt blood again when NT is finished.

## 2024-03-30 NOTE — ED Provider Notes (Signed)
 [place] EMERGENCY DEPARTMENT AT [facility] Provider Note   CSN: [id number] Arrival date & time: 03/30/24  1503     Patient presents with: No chief complaint on file.   [name] is a 78 y.o. female patient with past medical history of hypertension, diabetes, chronic kidney disease presents to emergency room after syncopal episode.  Patient reports that she was exercising on a bicycle when she started feeling very poorly -generalized weakness, lightheaded.  She stopped to rest but then lost consciousness.  She was still sitting in a stationary bike when she was found, did not have a fall.  She did vomit multiple times.  When EMS arrived patient was complaining of right leg weakness and pain.  She reports that this lasted several minutes and then completely resolved prior to arrival.  She reports she has had some similar episodes like this in the past when her blood pressure was dropping.  She does note that she recently was started on a diuretic due to bilateral lower extremity swelling.  She has been taking this daily for 4 days. Right now she complains of mild nausea, no other complaints.   Mild hypotensive with EMS, normal CBG, no seizure activity noted.   HPI     Prior to Admission medications   Medication Sig Start Date End Date Taking? Authorizing Provider  acetaminophen  (TYLENOL ) 500 MG tablet Take 500 mg by mouth every 6 (six) hours as needed for moderate pain or headache.    [provider]  Alcohol Swabs (B-D SINGLE USE SWABS REGULAR) PADS  08/29/16   [provider]  aspirin  EC 81 MG tablet Take 81 mg by mouth daily. Swallow whole.    [provider]  atorvastatin  (LIPITOR) 10 MG tablet Take 10 mg by mouth every evening.    [provider]  CALCIUM  PO Take by mouth. 2 tabs daily    [provider]  fexofenadine (ALLEGRA) 180 MG tablet Take 180 mg by mouth daily.    [provider]  hydrALAZINE  (APRESOLINE) 25 MG tablet Take 25 mg by mouth 2 (two) times daily.    [provider]  levothyroxine  (SYNTHROID , LEVOTHROID) 50 MCG tablet Take 50 mcg by mouth daily before breakfast.  09/14/16   [provider]  metFORMIN  (GLUCOPHAGE ) 500 MG tablet Take 500 mg by mouth 2 (two) times daily with a meal. 08/03/16   [provider]  nebivolol (BYSTOLIC) 10 MG tablet Take 10 mg by mouth daily.    [provider]  OVER THE COUNTER MEDICATION Apply 1 inch topically daily. Aspircream arthritis    [provider]  telmisartan (MICARDIS) 80 MG tablet Take 80 mg by mouth every evening.    [provider]  vitamin E (VITAMIN E) 400 UNIT capsule Take 400 Units by mouth daily.    [provider]    Allergies: Aliskiren, Clonidine derivatives, Codeine, Diclofenac, Diltiazem, Lisinopril, Losartan, Meloxicam , Metoprolol, Spironolactone, Tramadol, and Verapamil    Review of Systems  Gastrointestinal:  Positive for nausea.    Updated Vital Signs BP (!) 119/103 (BP Location: Left Arm)   Pulse 79   Temp 97.6 F (36.4 C) (Oral)   Resp 11   Ht 5' 5 (1.651 m)   Wt 65.8 kg   SpO2 100%   BMI 24.13 kg/m   Physical Exam Vitals and nursing note reviewed.  Constitutional:      General: She is not in acute distress.    Appearance: She  is not toxic-appearing.  HENT:     Head: Normocephalic and atraumatic.  Eyes:     General: No scleral icterus.    Conjunctiva/sclera: Conjunctivae normal.  Cardiovascular:     Rate and Rhythm: Normal rate and regular rhythm.     Pulses: Normal pulses.     Heart sounds: Normal heart sounds. No murmur heard. Pulmonary:     Effort: Pulmonary effort is normal. No respiratory distress.     Breath sounds: Normal breath sounds.  Abdominal:     General: Abdomen is flat. Bowel sounds are normal.     Palpations: Abdomen is soft.     Tenderness: There is no abdominal tenderness.  Genitourinary:    Comments: No melena  or hematochezia on exam. Musculoskeletal:     Right lower leg: Edema present.     Left lower leg: Edema present.  Skin:    General: Skin is warm and dry.     Findings: No lesion.  Neurological:     General: No focal deficit present.     Mental Status: She is alert and oriented to person, place, and time. Mental status is at baseline.  Psychiatric:     Comments: Nonfocal neuroexam.  Alert and oriented with no slurred speech, answers questions appropriately.  No visual field deficits.  No nystagmus.  Pupils equal and reactive. Upper extremity strength 5/5 bilateral, sensation intact. Strong radial pulse. No ataxia with finger to nose.  Lower extremity strength 5/5 bilateral, sensation intact. Strong DP pulse.      (all labs ordered are listed, but only abnormal results are displayed) Labs Reviewed  COMPREHENSIVE METABOLIC PANEL WITH GFR  CBC  LIPASE, BLOOD  URINALYSIS, ROUTINE W REFLEX MICROSCOPIC  BRAIN NATRIURETIC PEPTIDE  TROPONIN I (HIGH SENSITIVITY)    EKG: None  Radiology: No results found.   Procedures   Medications Ordered in the ED  sodium chloride  0.9 % bolus 500 mL (has no administration in time range)    Clinical Course as of 03/30/24 2303  Tue Mar 30, 2024  1608 Orthostatics positive here, will re-eval after fluid bolus to access improvement.  [JB]  1632 Discussed with Dr Vanessa with neurology on imaging recommendation, will add on CT angio head neck and Brain MRI. [JB]  1739 Labs clotted, delayed to redraw due to being in MRI [JB]  2258 Fecal Occult Blood, POC: NEGATIVE [JB]    Clinical Course User Index [JB] Kylyn Mcdade, Warren SAILOR, PA-C                                 Medical Decision Making Amount and/or Complexity of Data Reviewed Labs: ordered. Decision-making details documented in ED Course. Radiology: ordered.  Risk Prescription drug management. Decision regarding hospitalization.   This patient presents to the ED for concern of syncope,  this involves an extensive number of treatment options, and is a complaint that carries with it a high risk of complications and morbidity.  The differential diagnosis includes ACS, stable angina, CHF, pneumonia, pneumothorax, aortic dissection, pulmonary embolus, dehydration, orthostatic hypotension, electrolyte abnormality     Co morbidities that complicate the patient evaluation  CKD1, hypothyroid, DM, HTN     Lab Tests:  I personally interpreted labs.  The pertinent results include:   Patient has white blood cell 3.5, hemoglobin is 9.5 which is mildly downtrending since prior.  Hemoccult negative Mild hyponatremia. Troponin, delta negative, BMP within normal limits, rare bacteria in urine  but no urinary symptoms.   Imaging Studies ordered:  I ordered imaging studies including chest x-ray, CT angio head and neck, MR brain.  I independently visualized and interpreted imaging which showed no acute findings.  I agree with the radiologist interpretation   Cardiac Monitoring: / EKG:  The patient was maintained on a cardiac monitor.     Consultations Obtained:  I requested consultation with the neurology,  and discussed lab and imaging findings as well as pertinent plan - they recommend: CT angio head and neck, MRI of brain to rule out significant stenosis/acute findings.  Will consult hospital team for admission.    Problem List / ED Course / Critical interventions / Medication management  Presents with syncopal episode while exercising.  She notes that she started feeling very lightheaded and tried to stop but still passed out.  There was no noted seizure-like activity.  No loss of urine or lateral tongue biting.  She is hemodynamically stable.  She endorses some lower extremity swelling but no chest pain or shortness of breath.  BNP and troponins are normal here.  No findings on chest x-ray.  She did have associated right leg weakness during event.  No significant stenosis or  dissection on CT angio.  No findings on brain MRI.  I did discuss with neurology.  Patient's vitals initially mildly hypotensive, improved with fluids.  Stable and well-appearing here.  No fever. I ordered medication including 1L NS Reevaluation of the patient after these medicines showed that the patient stayed the same I have reviewed the patients home medicines and have made adjustments as needed. On recheck of orthostatics continues to have positive orthostatic vitals with sitting and standing. Patient requiring admission for high risk syncope specifically exertional syncope.  Does continue to have positive orthostatics and mild hyponatremia.      Final diagnoses:  Syncope, unspecified syncope type  Hyponatremia  Orthostatic hypotension    ED Discharge Orders     None          Shermon Warren SAILOR, PA-C 03/30/24 2307    Neysa Caron PARAS, DO 03/31/24 0002

## 2024-03-31 ENCOUNTER — Observation Stay (HOSPITAL_COMMUNITY)

## 2024-03-31 ENCOUNTER — Encounter (HOSPITAL_COMMUNITY): Payer: Self-pay | Admitting: Internal Medicine

## 2024-03-31 DIAGNOSIS — R55 Syncope and collapse: Secondary | ICD-10-CM

## 2024-03-31 DIAGNOSIS — N183 Chronic kidney disease, stage 3 unspecified: Secondary | ICD-10-CM | POA: Insufficient documentation

## 2024-03-31 DIAGNOSIS — I951 Orthostatic hypotension: Secondary | ICD-10-CM | POA: Diagnosis not present

## 2024-03-31 DIAGNOSIS — E039 Hypothyroidism, unspecified: Secondary | ICD-10-CM | POA: Insufficient documentation

## 2024-03-31 LAB — HEPATIC FUNCTION PANEL
ALT: 10 U/L (ref 0–44)
AST: 15 U/L (ref 15–41)
Albumin: 2.9 g/dL — ABNORMAL LOW (ref 3.5–5.0)
Alkaline Phosphatase: 30 U/L — ABNORMAL LOW (ref 38–126)
Bilirubin, Direct: 0.2 mg/dL (ref 0.0–0.2)
Indirect Bilirubin: 0.6 mg/dL (ref 0.3–0.9)
Total Bilirubin: 0.8 mg/dL (ref 0.0–1.2)
Total Protein: 5.1 g/dL — ABNORMAL LOW (ref 6.5–8.1)

## 2024-03-31 LAB — URINE CULTURE: Culture: <10000 — AB

## 2024-03-31 LAB — BASIC METABOLIC PANEL WITH GFR
Anion gap: 8 (ref 5–15)
BUN: 12 mg/dL (ref 8–23)
CO2: 22 mmol/L (ref 22–32)
Calcium: 8 mg/dL — ABNORMAL LOW (ref 8.9–10.3)
Chloride: 102 mmol/L (ref 98–111)
Creatinine, Ser: 1.03 mg/dL — ABNORMAL HIGH (ref 0.44–1.00)
GFR, Estimated: 56 mL/min — ABNORMAL LOW (ref >60)
Glucose, Bld: 107 mg/dL — ABNORMAL HIGH (ref 70–99)
Potassium: 4 mmol/L (ref 3.5–5.1)
Sodium: 132 mmol/L — ABNORMAL LOW (ref 135–145)

## 2024-03-31 LAB — HEMOGLOBIN A1C
Hgb A1c MFr Bld: 6 % — ABNORMAL HIGH (ref 4.8–5.6)
Mean Plasma Glucose: 125.5 mg/dL

## 2024-03-31 LAB — ECHOCARDIOGRAM COMPLETE
Area-P 1/2: 3.34 cm2
Height: 65 in
S' Lateral: 2.7 cm
Weight: 2320 [oz_av]

## 2024-03-31 LAB — GLUCOSE, CAPILLARY: Glucose-Capillary: 139 mg/dL — ABNORMAL HIGH (ref 70–99)

## 2024-03-31 LAB — TSH: TSH: 1.696 u[IU]/mL (ref 0.350–4.500)

## 2024-03-31 LAB — OSMOLALITY, URINE: Osmolality, Ur: 327 mosm/kg (ref 300–900)

## 2024-03-31 LAB — CBC WITH DIFFERENTIAL/PLATELET
Abs Immature Granulocytes: 0.01 K/uL (ref 0.00–0.07)
Basophils Absolute: 0 K/uL (ref 0.0–0.1)
Basophils Relative: 1 %
Eosinophils Absolute: 0.2 K/uL (ref 0.0–0.5)
Eosinophils Relative: 5 %
HCT: 31.1 % — ABNORMAL LOW (ref 36.0–46.0)
Hemoglobin: 10.2 g/dL — ABNORMAL LOW (ref 12.0–15.0)
Immature Granulocytes: 0 %
Lymphocytes Relative: 26 %
Lymphs Abs: 0.9 K/uL (ref 0.7–4.0)
MCH: 30.4 pg (ref 26.0–34.0)
MCHC: 32.8 g/dL (ref 30.0–36.0)
MCV: 92.6 fL (ref 80.0–100.0)
Monocytes Absolute: 0.3 K/uL (ref 0.1–1.0)
Monocytes Relative: 7 %
Neutro Abs: 2.2 K/uL (ref 1.7–7.7)
Neutrophils Relative %: 61 %
Platelets: 209 K/uL (ref 150–400)
RBC: 3.36 MIL/uL — ABNORMAL LOW (ref 3.87–5.11)
RDW: 13.9 % (ref 11.5–15.5)
WBC: 3.6 K/uL — ABNORMAL LOW (ref 4.0–10.5)
nRBC: 0 % (ref 0.0–0.2)

## 2024-03-31 LAB — CORTISOL: Cortisol, Plasma: 6.1 ug/dL

## 2024-03-31 LAB — SODIUM, URINE, RANDOM: Sodium, Ur: 73 mmol/L

## 2024-03-31 MED ORDER — INSULIN ASPART 100 UNIT/ML IJ SOLN: 0.0000 [IU] | Freq: Three times a day (TID) | INTRAMUSCULAR | Status: DC

## 2024-03-31 MED ORDER — ATORVASTATIN CALCIUM 10 MG PO TABS
10.0000 mg | ORAL_TABLET | Freq: Every evening | ORAL | Status: DC
  Administered 2024-03-31: 10 mg via ORAL
  Filled 2024-03-31: qty 1

## 2024-03-31 MED ORDER — LEVOTHYROXINE SODIUM 50 MCG PO TABS
50.0000 ug | ORAL_TABLET | Freq: Every day | ORAL | Status: DC
  Administered 2024-03-31 – 2024-04-01 (×2): 50 ug via ORAL
  Filled 2024-03-31: qty 2
  Filled 2024-03-31: qty 1

## 2024-03-31 MED ORDER — HYDRALAZINE HCL 25 MG PO TABS
25.0000 mg | ORAL_TABLET | Freq: Two times a day (BID) | ORAL | Status: DC
  Administered 2024-03-31 – 2024-04-01 (×3): 25 mg via ORAL
  Filled 2024-03-31 (×3): qty 1

## 2024-03-31 MED ORDER — ACETAMINOPHEN 650 MG RE SUPP: 650.0000 mg | Freq: Four times a day (QID) | RECTAL | Status: DC | PRN

## 2024-03-31 MED ORDER — SODIUM CHLORIDE 0.9 % IV SOLN: INTRAVENOUS | Status: DC

## 2024-03-31 MED ORDER — HEPARIN SODIUM (PORCINE) 5000 UNIT/ML IJ SOLN
5000.0000 [IU] | Freq: Three times a day (TID) | INTRAMUSCULAR | Status: DC
  Administered 2024-03-31 – 2024-04-01 (×3): 5000 [IU] via SUBCUTANEOUS
  Filled 2024-03-31 (×3): qty 1

## 2024-03-31 MED ORDER — ACETAMINOPHEN 325 MG PO TABS: 650.0000 mg | ORAL_TABLET | Freq: Four times a day (QID) | ORAL | Status: DC | PRN

## 2024-03-31 NOTE — ED Notes (Signed)
 Assited pt off bed pan for one urine occurrence. Cleaned pt and adjusted in bed assisted with set up of food tray

## 2024-03-31 NOTE — ED Notes (Signed)
 Bg 71 NOT CROSSING OVER

## 2024-03-31 NOTE — ED Notes (Signed)
 Unable to get labs ?

## 2024-03-31 NOTE — Progress Notes (Addendum)
 [facility]   ROUNDING   NOTE [name] FMW:[id number]  DOB: 1945/08/19  DOA: 03/30/2024  PCP: [name], MD  03/31/2024,7:31 AM  LOS: 0 days    Code Status: Full code     from: Home   78 year old female DM TY 2 HTN Previous breast cancer with chemo/XRT Previous admission 07/30/2018 4 possible TIA versus hyponatremia and hypoglycemia Underlying spondylosis with previous lumbar facet injections under care of Dr. [name] of [name] for right foot issues  Came to emergency room for syncopal episode  Chronology  10/21 came from gym while riding bike spinners sudden onset nausea the dizziness passed out diaphoretic slumped over hypotensive right leg weakness numbness started feeling poorly had a similar episode like this previously recently started a diuretic Review of chart by admitting physician shows was placed on Lasix 2 weeks ago by nephrology found to have low blood pressures in PCP office and was taken off antihypertensives Events at gym showed significant diaphoresis and vomiting     Pertinent imaging/studies till date  10/21 MRI brain nothing acute CT angio head and neck nothing acute sodium 128 stool occult for blood was negative patient bolused IV fluid-troponin 16 BNP 34   Assessment  & Plan :    Nausea vomiting dizziness likely related to hypotension in the setting of active exercise as well as mild hyponatremia Probable orthostatic hypotension as well At home apparently was on hydralazine 25 twice daily Bystolic 10 daily Micardis 80 every afternoon----patient reports stopping all of her blood pressure medications on 10/16 Here she is hypertensive with orthostatic drop as noted per orthostatic vitals She will need education about the same and mobility in the hallway to acclimatize herself to this and should be careful with blood pressure medication changes-PCP and nephrology will be notified of this admission to keep a close watch on this Her blood  pressure is pretty elevated in the 180 range while she is supine so I will resume her home med of hydralazine 25 twice daily, we will hold Bystolic 10 and telmisartan 80 until outpatient close follow-up Hyponatremia on admit-urine sodium 73 urine osmolality 327-serum osmolality calculated 268 raising concern for probable SIADH with combination of hypovolemic hyponatremia from the event with vomiting Sodium levels rose after being given normal saline Will challenge with saline 50 cc/h We will check her labs again tomorrow As an outpatient it sounds like [name] was telling her to drink a lot of tomato juice so because of the combination of possible SIADH and tea toast potomania we will fluid restrict to 1200 cc and give saline and recheck labs tomorrow morning Previous breast cancer with lumpectomy chemo and XRT Outpatient routine follow-up according to her oncologist Spondylosis with lumbar facet injections Continue usual pain control with Tylenol  650 every 6 as needed Stage II CKD No acute component-holding Micardis for now Hypercholesterolemia DM TY 2 Stopped home metformin  and stop sliding scale given CBGs in the 70 range  Data Reviewed today:  Sodium 132 potassium 4.0 BUN/creatinine 12/1.03 AST/ALT 15/10  DVT prophylaxis: Heparin  Status is: Observation The patient remains OBS appropriate and will d/c before 2 midnights.    Dispo/Global plan: Likely discharge home when improved   Time 50   Subjective:   No distress sitting up Asking if she can go home No weakness Note orthostatic drop in blood pressure when she stood  She is not having any slurred speech or anything else-she remembers that she had symptoms like this previously in 2020  Objective + exam Vitals:   03/31/24 0615 03/31/24 0645 03/31/24 0713 03/31/24 0714  BP:      Pulse: 86 88 83   Resp: 15 12 18    Temp:    98.7 F (37.1 C)  TempSrc:      SpO2: 99% 100% 100%   Weight:      Height:       Filed  Weights   03/30/24 1507  Weight: 65.8 kg     Examination: Alert pleasant no distress edentulous CTAB no added sound no wheeze rales rhonchi S1-S2 no murmur no rub no gallop Abdomen soft no rebound no guarding Power 5/5 He does have trace ankle edema  Scheduled Meds:  atorvastatin   10 mg Oral QPM   heparin  5,000 Units Subcutaneous Q8H   hydrALAZINE  25 mg Oral BID   insulin  aspart  0-9 Units Subcutaneous TID WC   levothyroxine   50 mcg Oral QAC breakfast   Continuous Infusions: acetaminophen  **OR** acetaminophen   [name], MD  [facility]

## 2024-03-31 NOTE — ED Notes (Signed)
 Assumed care of pt, found her resting in bed, no signs of distress noted.  RN observed slow and steady rise and fall of chest.

## 2024-03-31 NOTE — ED Notes (Signed)
 Pt states she was recently told to stop taking her BP pills and she could take her fluid pill until her swelling went down in her feet. Once her swelling went down she could take the fluid pill as needed.  Pt says she drinks adequate water and not sure how the dizzy spell came about.

## 2024-03-31 NOTE — ED Notes (Signed)
 IV paused for transportation upstairs. Pt and all belongings transported upstairs

## 2024-03-31 NOTE — ED Notes (Signed)
 Lab adding on sodium (urine) and osmolality.

## 2024-04-01 DIAGNOSIS — I951 Orthostatic hypotension: Secondary | ICD-10-CM | POA: Diagnosis not present

## 2024-04-01 DIAGNOSIS — R55 Syncope and collapse: Secondary | ICD-10-CM | POA: Diagnosis not present

## 2024-04-01 LAB — CBC WITH DIFFERENTIAL/PLATELET
Abs Immature Granulocytes: 0 K/uL (ref 0.00–0.07)
Basophils Absolute: 0 K/uL (ref 0.0–0.1)
Basophils Relative: 1 %
Eosinophils Absolute: 0.2 K/uL (ref 0.0–0.5)
Eosinophils Relative: 7 %
HCT: 26.2 % — ABNORMAL LOW (ref 36.0–46.0)
Hemoglobin: 8.8 g/dL — ABNORMAL LOW (ref 12.0–15.0)
Immature Granulocytes: 0 %
Lymphocytes Relative: 35 %
Lymphs Abs: 1.3 K/uL (ref 0.7–4.0)
MCH: 30.4 pg (ref 26.0–34.0)
MCHC: 33.6 g/dL (ref 30.0–36.0)
MCV: 90.7 fL (ref 80.0–100.0)
Monocytes Absolute: 0.2 K/uL (ref 0.1–1.0)
Monocytes Relative: 7 %
Neutro Abs: 1.8 K/uL (ref 1.7–7.7)
Neutrophils Relative %: 50 %
Platelets: 191 K/uL (ref 150–400)
RBC: 2.89 MIL/uL — ABNORMAL LOW (ref 3.87–5.11)
RDW: 14 % (ref 11.5–15.5)
WBC: 3.6 K/uL — ABNORMAL LOW (ref 4.0–10.5)
nRBC: 0 % (ref 0.0–0.2)

## 2024-04-01 LAB — BASIC METABOLIC PANEL WITH GFR
Anion gap: 10 (ref 5–15)
BUN: 10 mg/dL (ref 8–23)
CO2: 25 mmol/L (ref 22–32)
Calcium: 8.6 mg/dL — ABNORMAL LOW (ref 8.9–10.3)
Chloride: 99 mmol/L (ref 98–111)
Creatinine, Ser: 0.94 mg/dL (ref 0.44–1.00)
GFR, Estimated: >60 mL/min (ref >60)
Glucose, Bld: 92 mg/dL (ref 70–99)
Potassium: 3.8 mmol/L (ref 3.5–5.1)
Sodium: 134 mmol/L — ABNORMAL LOW (ref 135–145)

## 2024-04-01 LAB — GLUCOSE, CAPILLARY
Glucose-Capillary: 94 mg/dL (ref 70–99)
Glucose-Capillary: 110 mg/dL — ABNORMAL HIGH (ref 70–99)

## 2024-04-01 MED ORDER — METFORMIN HCL ER (OSM) 500 MG PO TB24: 500.0000 mg | ORAL_TABLET | Freq: Every day | ORAL | Status: AC

## 2024-04-01 MED ORDER — NEBIVOLOL HCL 5 MG PO TABS: 5.0000 mg | ORAL_TABLET | Freq: Every day | ORAL | Status: AC

## 2024-04-01 MED ORDER — NEBIVOLOL HCL 5 MG PO TABS
5.0000 mg | ORAL_TABLET | Freq: Every day | ORAL | Status: DC
  Administered 2024-04-01: 5 mg via ORAL
  Filled 2024-04-01: qty 1

## 2024-04-01 NOTE — Progress Notes (Signed)
    Durable Medical Equipment  (From admission, onward)           Start     Ordered   04/01/24 1106  For home use only DME Bedside commode  Once       Comments: Needs 3:1 since unable to ambulate to bathroom facilities due to physical deconditioning and syncope  Question:  Patient needs a bedside commode to treat with the following condition  Answer:  Syncope   04/01/24 1106

## 2024-04-01 NOTE — Care Management Obs Status (Signed)
 MEDICARE OBSERVATION STATUS NOTIFICATION   Patient Details  Name: [name] MRN: [id number] Date of Birth: [birth year]   Medicare Observation Status Notification Given:  Yes Verbally reviewed observation notice with [name] telephonically at [phone].  Copy will be mailed to patient home.      [name] 04/01/2024, 9:04 AM

## 2024-04-01 NOTE — Discharge Summary (Signed)
 Physician Discharge Summary  SYBELLA HARNISH FMW:[id number] DOB: [birth year] DOA: 03/30/2024  PCP: [name], MD  Admit date: 03/30/2024 Discharge date: 04/01/2024  Time spent: 36 minutes  Recommendations for Outpatient Follow-up:  Note significant dosage changes to medications and discontinuation of the same Recommend close follow-up with nephrology Dr. ALONSO Stank who is adjusting these medications in the outpatient-patient will need Bystolic at discharge only no more Lasix  Discharge Diagnoses:  MAIN problem for hospitalization   Orthostatic versus vasovagal syncope  Please see below for itemized issues addressed in HOpsital- refer to other progress notes for clarity if needed  Discharge Condition: Improved  Diet recommendation: Diabetic heart healthy  Filed Weights   03/30/24 1507  Weight: 65.8 kg    History of present illness:  78 year old female DM TY 2 HTN Previous breast cancer with chemo/XRT Previous admission 07/30/2018 4 possible TIA versus hyponatremia and hypoglycemia Underlying spondylosis with previous lumbar facet injections under care of Dr. [name] of [name] for right foot issues   Came to emergency room for syncopal episode   Chronology  10/21 came from gym while riding bike spinners sudden onset nausea the dizziness passed out diaphoretic slumped over hypotensive right leg weakness numbness started feeling poorly had a similar episode like this previously recently started a diuretic Review of chart by admitting physician shows was placed on Lasix 2 weeks ago by nephrology found to have low blood pressures in PCP office and was taken off antihypertensives Events at gym showed significant diaphoresis and vomiting        Pertinent imaging/studies till date  10/21 MRI brain nothing acute CT angio head and neck nothing acute sodium 128 stool occult for blood was negative patient bolused IV fluid-troponin 16 BNP 34  Hospital Course:   Nausea vomiting dizziness Orthostatic hypotension All blood pressure meds held other than Bystolic She will not be taking any further Lasix as well There was an orthostatic drop She was educated about standing carefully and being careful with getting up and moving Hyponatremia SIADH urine sodium 73 urine osmolality 327 serum osmolality 268 Have encouraged her not to drink too much fluid-we challenged her with IV saline and has improved so there may be a combination picture She can have labs in a week Previous breast cancer with lumpectomy chemo XRT in the past Outpatient routine follow-up with oncology Diabetes mellitus type 2 A1c 6 May be too tightly controlled for 78 year old-independent risk factor for morbidity-I have cut back the dose of metformin  this hospital stay for now and can be increased in the outpatient setting   Discharge Exam: Vitals:   04/01/24 0849 04/01/24 1202  BP: 136/79 (!) 155/88  Pulse: 90 82  Resp: 18 17  Temp: 98.8 F (37.1 C) 98.3 F (36.8 C)  SpO2: 100% 99%    Subj on day of d/c   Awake alert no distress looks well feels well  General Exam on discharge  EOMI NCAT no focal deficit no icterus no pallor looks well feels well no distress CTAB no added sound no wheeze rales rhonchi S1-S2 no murmur Some couplets on monitor  Discharge Instructions   Discharge Instructions     Diet - low sodium heart healthy   Complete by: As directed    Discharge instructions   Complete by: As directed    Look at meds carefully---several have changed--follow with Dr. Stank to tweak them Keep a check on ur blood pressure---take records of these checks to Dr. Stank No further fluid  medicines   Use the bedside commode to urinate--be careful with getting up---take several minutes before changing positions   Increase activity slowly   Complete by: As directed       Allergies as of 04/01/2024       Reactions   Aliskiren    Muscle and foot pain    Clonidine Derivatives    swelling   Codeine    makes me feel funny    Diclofenac    Diltiazem    swelling   Lisinopril Cough   Losartan Cough   Meloxicam     Headache    Metoprolol Swelling   Spironolactone Nausea Only   Tramadol    sob   Verapamil    headache        Medication List     STOP taking these medications    B-D SINGLE USE SWABS REGULAR Pads   CALCIUM  PO   furosemide 20 MG tablet Commonly known as: LASIX   hydrALAZINE 25 MG tablet Commonly known as: APRESOLINE   lidocaine  2 % solution Commonly known as: XYLOCAINE    telmisartan 80 MG tablet Commonly known as: MICARDIS       TAKE these medications    acetaminophen  500 MG tablet Commonly known as: TYLENOL  Take 500 mg by mouth every 6 (six) hours as needed for moderate pain or headache.   aspirin  EC 81 MG tablet Take 81 mg by mouth daily. Swallow whole.   atorvastatin  10 MG tablet Commonly known as: LIPITOR Take 10 mg by mouth every evening.   fexofenadine 180 MG tablet Commonly known as: ALLEGRA Take 180 mg by mouth daily.   levothyroxine  50 MCG tablet Commonly known as: SYNTHROID  Take 50 mcg by mouth daily before breakfast.   metformin  500 MG (OSM) 24 hr tablet Commonly known as: FORTAMET  Take 1 tablet (500 mg total) by mouth daily with breakfast.   nebivolol 5 MG tablet Commonly known as: BYSTOLIC Take 1 tablet (5 mg total) by mouth daily. Start taking on: April 02, 2024 What changed:  medication strength how much to take   OVER THE COUNTER MEDICATION Apply 1 inch topically daily. Aspircream arthritis   prednisoLONE acetate 1 % ophthalmic suspension Commonly known as: PRED FORTE Place 1 drop into the left eye 4 (four) times daily.   tobramycin 0.3 % ophthalmic solution Commonly known as: TOBREX PLEASE SEE ATTACHED FOR DETAILED DIRECTIONS   vitamin E 180 MG (400 UNITS) capsule Take 400 Units by mouth daily.               Durable Medical Equipment  (From  admission, onward)           Start     Ordered   04/01/24 1355  DME 3-in-1  Once        04/01/24 1356   04/01/24 1106  For home use only DME Bedside commode  Once       Comments: Needs 3:1 since unable to ambulate to bathroom facilities due to physical deconditioning and syncope  Question:  Patient needs a bedside commode to treat with the following condition  Answer:  Syncope   04/01/24 1106           Allergies  Allergen Reactions   Aliskiren     Muscle and foot pain   Clonidine Derivatives     swelling   Codeine     makes me feel funny    Diclofenac    Diltiazem     swelling   Lisinopril  Cough   Losartan Cough   Meloxicam      Headache    Metoprolol Swelling   Spironolactone Nausea Only   Tramadol     sob   Verapamil     headache    Follow-up Information     [facility] Follow up.   Specialty: Home Health Services Why: [facility] will provide home health services.  They will call you to set up services in the next 24-48 hours. Contact information: [address] [place] [address] [phone]         [facility] Follow up.   Why: [facility] will deliver a bedside commode to your bedside. Contact information: [address] [place] [address] [phone]                  The results of significant diagnostics from this hospitalization (including imaging, microbiology, ancillary and laboratory) are listed below for reference.    Significant Diagnostic Studies: ECHOCARDIOGRAM COMPLETE Result Date: 03/31/2024    ECHOCARDIOGRAM REPORT   Patient Name:   GUYNELL KLEIBER Date of Exam: 03/31/2024 Medical Rec #:  [id number]         Height:       65.0 in Accession #:    [id number]        Weight:       145.0 lb Date of Birth:  1945/12/27         BSA:          1.725 m Patient Age:    78 years          BP:           161/108 mmHg Patient Gender: F                 HR:           122 bpm. Exam  Location:  Inpatient Procedure: 2D Echo, Cardiac Doppler and Color Doppler (Both Spectral and Color            Flow Doppler were utilized during procedure). Indications:    Syncope  History:        Patient has no prior history of Echocardiogram examinations.                 Risk Factors:Hypertension and Diabetes.  Sonographer:    Philomena Daring Referring Phys: [id number] ARSHAD N KAKRAKANDY IMPRESSIONS  1. Left ventricular ejection fraction, by estimation, is 55 to 60%. The left ventricle has normal function. The left ventricle has no regional wall motion abnormalities. There is moderate concentric left ventricular hypertrophy. Left ventricular diastolic parameters are consistent with Grade I diastolic dysfunction (impaired relaxation).  2. Right ventricular systolic function is normal. The right ventricular size is normal.  3. The mitral valve is normal in structure. Trivial mitral valve regurgitation. No evidence of mitral stenosis.  4. The aortic valve is tricuspid. Aortic valve regurgitation is not visualized. No aortic stenosis is present.  5. The inferior vena cava is normal in size with greater than 50% respiratory variability, suggesting right atrial pressure of 3 mmHg. Comparison(s): No prior Echocardiogram. Conclusion(s)/Recommendation(s): Normal biventricular function without evidence of hemodynamically significant valvular heart disease. FINDINGS  Left Ventricle: Left ventricular ejection fraction, by estimation, is 55 to 60%. The left ventricle has normal function. The left ventricle has no regional wall motion abnormalities. The left ventricular internal cavity size was normal in size. There is  moderate concentric left ventricular hypertrophy. Left  ventricular diastolic parameters are consistent with Grade I diastolic dysfunction (impaired relaxation). Right Ventricle: The right ventricular size is normal. No increase in right ventricular wall thickness. Right ventricular systolic function is normal. Left  Atrium: Left atrial size was normal in size. Right Atrium: Right atrial size was normal in size. Pericardium: There is no evidence of pericardial effusion. Mitral Valve: The mitral valve is normal in structure. Trivial mitral valve regurgitation. No evidence of mitral valve stenosis. Tricuspid Valve: The tricuspid valve is normal in structure. Tricuspid valve regurgitation is trivial. No evidence of tricuspid stenosis. Aortic Valve: The aortic valve is tricuspid. Aortic valve regurgitation is not visualized. No aortic stenosis is present. Pulmonic Valve: The pulmonic valve was grossly normal. Pulmonic valve regurgitation is trivial. No evidence of pulmonic stenosis. Aorta: The aortic root, ascending aorta, aortic arch and descending aorta are all structurally normal, with no evidence of dilitation or obstruction. Venous: The inferior vena cava is normal in size with greater than 50% respiratory variability, suggesting right atrial pressure of 3 mmHg. IAS/Shunts: The atrial septum is grossly normal.  LEFT VENTRICLE PLAX 2D LVIDd:         3.60 cm   Diastology LVIDs:         2.70 cm   LV e' medial:    5.66 cm/s LV PW:         1.31 cm   LV E/e' medial:  10.7 LV IVS:        1.32 cm   LV e' lateral:   5.98 cm/s LVOT diam:     2.30 cm   LV E/e' lateral: 10.1 LV SV:         70 LV SV Index:   40 LVOT Area:     4.15 cm  RIGHT VENTRICLE            IVC RV S prime:     8.81 cm/s  IVC diam: 1.20 cm TAPSE (M-mode): 1.8 cm LEFT ATRIUM             Index        RIGHT ATRIUM           Index LA diam:        2.80 cm 1.62 cm/m   RA Area:     16.80 cm LA Vol (A2C):   39.5 ml 22.89 ml/m  RA Volume:   46.90 ml  27.18 ml/m LA Vol (A4C):   34.7 ml 20.11 ml/m LA Biplane Vol: 38.5 ml 22.31 ml/m  AORTIC VALVE LVOT Vmax:   80.30 cm/s LVOT Vmean:  54.100 cm/s LVOT VTI:    0.168 m  AORTA Ao Root diam: 2.90 cm Ao Asc diam:  2.90 cm MITRAL VALVE MV Area (PHT): 3.34 cm    SHUNTS MV Decel Time: 227 msec    Systemic VTI:  0.17 m MV E velocity:  60.40 cm/s  Systemic Diam: 2.30 cm MV A velocity: 87.00 cm/s MV E/A ratio:  0.69 Shelda Bruckner MD Electronically signed by Shelda Bruckner MD Signature Date/Time: 03/31/2024/2:29:46 PM    Final    MR BRAIN WO CONTRAST Result Date: 03/30/2024 EXAM: MRI BRAIN WITHOUT CONTRAST 03/30/2024 08:11:24 PM TECHNIQUE: Multiplanar multisequence MRI of the head/brain was performed without the administration of intravenous contrast. COMPARISON: 07/30/2018 CLINICAL HISTORY: Acute neuro deficit, stroke suspected. Patient experienced sudden onset nausea, dizziness, and passed out while at the gym, with associated copious vomiting. Upon EMS arrival, she was diaphoretic, slumped over, and slow to respond, but oriented x4. Initial  hypotension. Patient denied pain. Right leg weakness and numbness were noted by EMS, which has since resolved. FINDINGS: BRAIN AND VENTRICLES: No acute infarct. No intracranial hemorrhage. No mass. No midline shift. No hydrocephalus. Multifocal hyperintense T2-weighted signal within the cerebral white matter, most commonly due to chronic small vessel disease. The sella is unremarkable. Normal flow voids. ORBITS: Left ocular lens replacement. SINUSES AND MASTOIDS: No acute abnormality. BONES AND SOFT TISSUES: Normal marrow signal. No acute soft tissue abnormality. IMPRESSION: 1. No acute intracranial abnormality. 2. Multifocal T2 hyperintense white matter signal, most consistent with chronic small vessel disease. Electronically signed by: Franky Stanford MD 03/30/2024 08:26 PM EDT RP Workstation: [id number]   CT ANGIO HEAD NECK W WO CM Result Date: 03/30/2024 EXAM: CTA HEAD AND NECK WITHOUT AND WITH 03/30/2024 06:19:00 PM TECHNIQUE: CTA of the head and neck was performed without and with the administration of 75 mL of intravenous contrast (iohexol  (OMNIPAQUE ) 350 MG/ML injection 75 mL IOHEXOL  350 MG/ML SOLN). Multiplanar 2D and/or 3D reformatted images are provided for review. Automated  exposure control, iterative reconstruction, and/or weight based adjustment of the mA/kV was utilized to reduce the radiation dose to as low as reasonably achievable. Stenosis of the internal carotid arteries measured using NASCET criteria. COMPARISON: None available CLINICAL HISTORY: Transient ischemic attack (TIA). Syncope. Patient experienced sudden onset nausea, dizziness, and passed out while at the gym. Witnesses reported copious vomiting. Upon EMS arrival, the patient was diaphoretic, slumped over, slow to respond, but alert and oriented x4. Initially hypotensive. Patient denies pain. EMS noted right leg weakness and numbness, which has since resolved. Patient routinely goes to the gym MWF. FINDINGS: CTA NECK: AORTIC ARCH AND ARCH VESSELS: No dissection or arterial injury. No significant stenosis of the brachiocephalic or subclavian arteries. CERVICAL CAROTID ARTERIES: No dissection, arterial injury, or hemodynamically significant stenosis by NASCET criteria. CERVICAL VERTEBRAL ARTERIES: No dissection, arterial injury, or significant stenosis. LUNGS AND MEDIASTINUM: Unremarkable. SOFT TISSUES: Status post left hemithyroidectomy. 6 mm nodule at the left thyroid  bed is nonspecific and could be a lymph node or parathyroid tissue. BONES: No acute abnormality. CTA HEAD: ANTERIOR CIRCULATION: No significant stenosis of the internal carotid arteries. No significant stenosis of the anterior cerebral arteries. No significant stenosis of the middle cerebral arteries. No aneurysm. POSTERIOR CIRCULATION: No significant stenosis of the posterior cerebral arteries. No significant stenosis of the basilar artery. No significant stenosis of the vertebral arteries. No aneurysm. OTHER: No dural venous sinus thrombosis on this non-dedicated study. BRAIN: Chronic ischemic white matter changes. No mass, hemorrhage, or extra-axial collection. IMPRESSION: 1. No large vessel occlusion, hemodynamically significant stenosis, or aneurysm  in the head or neck. 2. Chronic ischemic white matter changes without mass, hemorrhage, or extra-axial collection. Electronically signed by: Franky Stanford MD 03/30/2024 07:14 PM EDT RP Workstation: [id number]   DG Chest Portable 1 View Result Date: 03/30/2024 EXAM: 1 VIEW(S) XRAY OF THE CHEST 03/30/2024 04:18:00 PM COMPARISON: None available. CLINICAL HISTORY: syncope. Syncope. [place] coming with patient from gym. Patient was riding her bike/spinner at the gym - Sudden onset nausea/dizziness/and passed out as per witnesses. Copious amounts of vomit. When EMS arrived she was diaphoretic and slumped over, slow to respond, but AxOx4. Hypotensive initially. Pt denies pain. When EMS arrived the patient complained of R leg weakness and numbness, but EMS states this could from been how the patient was sitting in the chair when they found her. Has now resolved. FINDINGS: LUNGS AND PLEURA: No focal pulmonary opacity. No pulmonary edema. No pleural  effusion. No pneumothorax. HEART AND MEDIASTINUM: No acute abnormality of the cardiac and mediastinal silhouettes. BONES AND SOFT TISSUES: Moderate thoracic spondylosis. IMPRESSION: 1. No acute cardiopulmonary process. Electronically signed by: Norman Gatlin MD 03/30/2024 04:29 PM EDT RP Workstation: [id number]    Microbiology: Recent Results (from the past 240 hours)  Urine Culture     Status: Abnormal   Collection Time: 03/30/24  3:34 PM   Specimen: Urine, Clean Catch  Result Value Ref Range Status   Specimen Description URINE, CLEAN CATCH  Final   Special Requests NONE  Final   Culture (A)  Final    <10,000 COLONIES/mL INSIGNIFICANT GROWTH Performed at [facility], [address], [place], [place] [address]    Report Status 03/31/2024 FINAL  Final     Labs: Basic Metabolic Panel: Recent Labs  Lab 03/30/24 1534 03/30/24 1833 03/31/24 0332 04/01/24 0249  NA 129* 128* 132* 134*  K 4.6 4.9 4.0 3.8  CL 96* 92* 102 99  CO2 20*  --  22 25   GLUCOSE 126* 97 107* 92  BUN 13 14 12 10   CREATININE 1.18* 1.10* 1.03* 0.94  CALCIUM  8.6*  --  8.0* 8.6*   Liver Function Tests: Recent Labs  Lab 03/30/24 1534 03/31/24 0332  AST 24 15  ALT 10 10  ALKPHOS 33* 30*  BILITOT 0.9 0.8  PROT 6.3* 5.1*  ALBUMIN 3.5 2.9*   Recent Labs  Lab 03/30/24 1534  LIPASE 17   No results for input(s): AMMONIA in the last 168 hours. CBC: Recent Labs  Lab 03/30/24 1745 03/30/24 1833 03/31/24 1402 04/01/24 0249  WBC 3.5*  --  3.6* 3.6*  NEUTROABS  --   --  2.2 1.8  HGB 9.5* 9.9* 10.2* 8.8*  HCT 29.0* 29.0* 31.1* 26.2*  MCV 93.5  --  92.6 90.7  PLT 193  --  209 191   Cardiac Enzymes: No results for input(s): CKTOTAL, CKMB, CKMBINDEX, TROPONINI in the last 168 hours. BNP: BNP (last 3 results) Recent Labs    03/30/24 1536  BNP 34.0    ProBNP (last 3 results) No results for input(s): PROBNP in the last 8760 hours.  CBG: Recent Labs  Lab 03/31/24 1602 04/01/24 0824 04/01/24 1148  GLUCAP 139* 94 110*    Signed:  Colen Grimes MD   [facility] 04/01/2024, 2:24 PM

## 2024-04-01 NOTE — Progress Notes (Addendum)
 Transition of Care [facility]) - Inpatient Brief Assessment   Patient Details  Name: [name] MRN: [id number] Date of Birth: [birth year]  [facility] CM/SW Contact:    Rosaline JONELLE Joe, RN Phone Number: 04/01/2024, 10:52 AM   Clinical Narrative: Patient admitted to the hospital with Syncope.  Patient lives with spouse at the home and plans to return home today when stable.  I spoke with Ronal, RN and she states that patient will likely need HH PT.  I spoke with the patient at the bedside and offered Medicare choice regarding home health and she did not have a preference.  I called [facility] and Cory, RNCM accepted for [facility] PT.  HH orders placed - to be co-signed by MD.  Patient states that she uses not DME at the home but that her spouse has a [employer] that she can use.  I asked MD to place PT eval if necessary.  Patient plans to return home by car today.  04/01/24 I met with the patient and MD states that patient will need bedside commode.  DME order and DMe note placed to be co-signed by MD.  Patient was offered Medicare choice regarding DMe and she did not have a preference.  I called [employer] and bedside commode was ordered to be delivered to the hospital room today.   Transition of Care Asessment: Insurance and Status: (P) Insurance coverage has been reviewed Patient has primary care physician: (P) Yes Home environment has been reviewed: (P) from home with neice Prior level of function:: (P) self Prior/Current Home Services: (P) No current home services Social Drivers of Health Review: (P) SDOH reviewed interventions complete Readmission risk has been reviewed: (P) Yes Transition of care needs: (P) transition of care needs identified, TOC will continue to follow

## 2024-04-02 LAB — GLUCOSE, CAPILLARY: Glucose-Capillary: 71 mg/dL (ref 70–99)

## 2024-04-06 DIAGNOSIS — E1122 Type 2 diabetes mellitus with diabetic chronic kidney disease: Secondary | ICD-10-CM | POA: Diagnosis not present

## 2024-04-06 DIAGNOSIS — R609 Edema, unspecified: Secondary | ICD-10-CM | POA: Diagnosis not present

## 2024-04-06 DIAGNOSIS — I129 Hypertensive chronic kidney disease with stage 1 through stage 4 chronic kidney disease, or unspecified chronic kidney disease: Secondary | ICD-10-CM | POA: Diagnosis not present

## 2024-04-06 DIAGNOSIS — E871 Hypo-osmolality and hyponatremia: Secondary | ICD-10-CM | POA: Diagnosis not present

## 2024-04-06 DIAGNOSIS — N189 Chronic kidney disease, unspecified: Secondary | ICD-10-CM | POA: Diagnosis not present

## 2024-04-06 DIAGNOSIS — N182 Chronic kidney disease, stage 2 (mild): Secondary | ICD-10-CM | POA: Diagnosis not present

## 2024-04-09 DIAGNOSIS — E113293 Type 2 diabetes mellitus with mild nonproliferative diabetic retinopathy without macular edema, bilateral: Secondary | ICD-10-CM | POA: Diagnosis not present

## 2024-04-09 DIAGNOSIS — E1122 Type 2 diabetes mellitus with diabetic chronic kidney disease: Secondary | ICD-10-CM | POA: Diagnosis not present

## 2024-04-09 DIAGNOSIS — E039 Hypothyroidism, unspecified: Secondary | ICD-10-CM | POA: Diagnosis not present

## 2024-04-09 DIAGNOSIS — E113391 Type 2 diabetes mellitus with moderate nonproliferative diabetic retinopathy without macular edema, right eye: Secondary | ICD-10-CM | POA: Diagnosis not present

## 2024-04-14 DIAGNOSIS — M47816 Spondylosis without myelopathy or radiculopathy, lumbar region: Secondary | ICD-10-CM | POA: Diagnosis not present

## 2024-04-19 DIAGNOSIS — E113391 Type 2 diabetes mellitus with moderate nonproliferative diabetic retinopathy without macular edema, right eye: Secondary | ICD-10-CM | POA: Diagnosis not present

## 2024-04-19 DIAGNOSIS — Z136 Encounter for screening for cardiovascular disorders: Secondary | ICD-10-CM | POA: Diagnosis not present

## 2024-04-19 DIAGNOSIS — Z0000 Encounter for general adult medical examination without abnormal findings: Secondary | ICD-10-CM | POA: Diagnosis not present

## 2024-04-19 DIAGNOSIS — M792 Neuralgia and neuritis, unspecified: Secondary | ICD-10-CM | POA: Diagnosis not present

## 2024-04-19 DIAGNOSIS — I10 Essential (primary) hypertension: Secondary | ICD-10-CM | POA: Diagnosis not present

## 2024-04-19 DIAGNOSIS — Z13220 Encounter for screening for lipoid disorders: Secondary | ICD-10-CM | POA: Diagnosis not present

## 2024-04-19 DIAGNOSIS — E039 Hypothyroidism, unspecified: Secondary | ICD-10-CM | POA: Diagnosis not present

## 2024-04-19 DIAGNOSIS — Z853 Personal history of malignant neoplasm of breast: Secondary | ICD-10-CM | POA: Diagnosis not present

## 2024-04-19 DIAGNOSIS — E871 Hypo-osmolality and hyponatremia: Secondary | ICD-10-CM | POA: Diagnosis not present

## 2024-04-19 DIAGNOSIS — I951 Orthostatic hypotension: Secondary | ICD-10-CM | POA: Diagnosis not present

## 2024-04-28 DIAGNOSIS — M25571 Pain in right ankle and joints of right foot: Secondary | ICD-10-CM | POA: Diagnosis not present

## 2024-04-28 DIAGNOSIS — S92351D Displaced fracture of fifth metatarsal bone, right foot, subsequent encounter for fracture with routine healing: Secondary | ICD-10-CM | POA: Diagnosis not present

## 2024-04-28 DIAGNOSIS — M79671 Pain in right foot: Secondary | ICD-10-CM | POA: Diagnosis not present

## 2024-05-09 DIAGNOSIS — E1122 Type 2 diabetes mellitus with diabetic chronic kidney disease: Secondary | ICD-10-CM | POA: Diagnosis not present

## 2024-05-09 DIAGNOSIS — E113391 Type 2 diabetes mellitus with moderate nonproliferative diabetic retinopathy without macular edema, right eye: Secondary | ICD-10-CM | POA: Diagnosis not present

## 2024-05-09 DIAGNOSIS — E113293 Type 2 diabetes mellitus with mild nonproliferative diabetic retinopathy without macular edema, bilateral: Secondary | ICD-10-CM | POA: Diagnosis not present

## 2024-05-09 DIAGNOSIS — E039 Hypothyroidism, unspecified: Secondary | ICD-10-CM | POA: Diagnosis not present
